# Patient Record
Sex: Female | Born: 1955 | Race: White | Hispanic: No | Smoking: Never smoker
Health system: Southern US, Community
[De-identification: ages and names within clinical notes are randomized; demographics above are authoritative.]

## PROBLEM LIST (undated history)

## (undated) DIAGNOSIS — I10 Essential (primary) hypertension: Secondary | ICD-10-CM

## (undated) DIAGNOSIS — M797 Fibromyalgia: Secondary | ICD-10-CM

## (undated) DIAGNOSIS — J45909 Unspecified asthma, uncomplicated: Secondary | ICD-10-CM

## (undated) DIAGNOSIS — K219 Gastro-esophageal reflux disease without esophagitis: Secondary | ICD-10-CM

## (undated) DIAGNOSIS — F419 Anxiety disorder, unspecified: Secondary | ICD-10-CM

## (undated) HISTORY — PX: NECK SURGERY: SHX720

---

## 1997-08-27 ENCOUNTER — Encounter: Admission: RE | Admit: 1997-08-27 | Discharge: 1997-11-25 | Payer: Self-pay | Admitting: Neurosurgery

## 1997-09-04 ENCOUNTER — Ambulatory Visit (HOSPITAL_COMMUNITY): Admission: RE | Admit: 1997-09-04 | Discharge: 1997-09-04 | Payer: Self-pay | Admitting: Neurosurgery

## 1998-02-17 ENCOUNTER — Encounter: Admission: RE | Admit: 1998-02-17 | Discharge: 1998-05-18 | Payer: Self-pay | Admitting: Orthopedic Surgery

## 1998-05-24 ENCOUNTER — Encounter: Admission: RE | Admit: 1998-05-24 | Discharge: 1998-08-22 | Payer: Self-pay | Admitting: Orthopedic Surgery

## 1999-02-04 ENCOUNTER — Encounter: Payer: Self-pay | Admitting: Neurosurgery

## 1999-02-04 ENCOUNTER — Ambulatory Visit (HOSPITAL_COMMUNITY): Admission: RE | Admit: 1999-02-04 | Discharge: 1999-02-04 | Payer: Self-pay | Admitting: Neurosurgery

## 2003-10-29 ENCOUNTER — Ambulatory Visit (HOSPITAL_COMMUNITY): Admission: RE | Admit: 2003-10-29 | Discharge: 2003-10-29 | Payer: Self-pay | Admitting: Family Medicine

## 2004-08-16 ENCOUNTER — Ambulatory Visit (HOSPITAL_COMMUNITY): Admission: RE | Admit: 2004-08-16 | Discharge: 2004-08-16 | Payer: Self-pay | Admitting: Neurosurgery

## 2005-02-03 ENCOUNTER — Encounter: Admission: RE | Admit: 2005-02-03 | Discharge: 2005-02-03 | Payer: Self-pay | Admitting: Orthopedic Surgery

## 2006-08-01 ENCOUNTER — Inpatient Hospital Stay (HOSPITAL_COMMUNITY): Admission: RE | Admit: 2006-08-01 | Discharge: 2006-08-02 | Payer: Self-pay | Admitting: Orthopaedic Surgery

## 2010-06-19 ENCOUNTER — Encounter: Payer: Self-pay | Admitting: Neurosurgery

## 2011-11-06 DIAGNOSIS — I10 Essential (primary) hypertension: Secondary | ICD-10-CM

## 2012-06-17 ENCOUNTER — Encounter (HOSPITAL_COMMUNITY): Payer: Self-pay | Admitting: Family Medicine

## 2012-06-17 ENCOUNTER — Emergency Department (HOSPITAL_COMMUNITY): Payer: Medicaid - Out of State

## 2012-06-17 ENCOUNTER — Emergency Department (HOSPITAL_COMMUNITY)
Admission: EM | Admit: 2012-06-17 | Discharge: 2012-06-17 | Disposition: A | Discharge status: Home / Self Care | Payer: Medicaid - Out of State | Attending: Emergency Medicine | Admitting: Emergency Medicine

## 2012-06-17 DIAGNOSIS — Z79899 Other long term (current) drug therapy: Secondary | ICD-10-CM | POA: Insufficient documentation

## 2012-06-17 DIAGNOSIS — I10 Essential (primary) hypertension: Secondary | ICD-10-CM | POA: Insufficient documentation

## 2012-06-17 DIAGNOSIS — K219 Gastro-esophageal reflux disease without esophagitis: Secondary | ICD-10-CM | POA: Insufficient documentation

## 2012-06-17 DIAGNOSIS — M549 Dorsalgia, unspecified: Secondary | ICD-10-CM

## 2012-06-17 DIAGNOSIS — IMO0002 Reserved for concepts with insufficient information to code with codable children: Secondary | ICD-10-CM | POA: Insufficient documentation

## 2012-06-17 DIAGNOSIS — Y92410 Unspecified street and highway as the place of occurrence of the external cause: Secondary | ICD-10-CM | POA: Insufficient documentation

## 2012-06-17 DIAGNOSIS — M542 Cervicalgia: Secondary | ICD-10-CM

## 2012-06-17 DIAGNOSIS — S0993XA Unspecified injury of face, initial encounter: Secondary | ICD-10-CM | POA: Insufficient documentation

## 2012-06-17 DIAGNOSIS — Y939 Activity, unspecified: Secondary | ICD-10-CM | POA: Insufficient documentation

## 2012-06-17 HISTORY — DX: Gastro-esophageal reflux disease without esophagitis: K21.9

## 2012-06-17 HISTORY — DX: Essential (primary) hypertension: I10

## 2012-06-17 MED ORDER — NAPROXEN 500 MG PO TABS: 500.0000 mg | ORAL_TABLET | Freq: Two times a day (BID) | ORAL | Status: DC
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
## 2012-06-17 NOTE — ED Notes (Signed)
NAD noted at time of d/c home with family 
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
## 2012-06-18 NOTE — ED Provider Notes (Signed)
Medical screening examination/treatment/procedure(s) were performed by non-physician practitioner and as supervising physician I was immediately available for consultation/collaboration.   Rolan Bucco, MD 06/18/12 1447

## 2017-01-19 ENCOUNTER — Inpatient Hospital Stay (HOSPITAL_COMMUNITY)
Admission: EM | Admit: 2017-01-19 | Discharge: 2017-01-22 | DRG: 305 | Disposition: A | Discharge status: Home / Self Care | Payer: Medicaid - Out of State | Attending: Family Medicine | Admitting: Family Medicine

## 2017-01-19 ENCOUNTER — Emergency Department (HOSPITAL_COMMUNITY): Payer: Medicaid - Out of State

## 2017-01-19 ENCOUNTER — Encounter (HOSPITAL_COMMUNITY): Payer: Self-pay | Admitting: Emergency Medicine

## 2017-01-19 DIAGNOSIS — I161 Hypertensive emergency: Secondary | ICD-10-CM | POA: Diagnosis present

## 2017-01-19 DIAGNOSIS — G8929 Other chronic pain: Secondary | ICD-10-CM | POA: Diagnosis present

## 2017-01-19 DIAGNOSIS — R51 Headache: Secondary | ICD-10-CM | POA: Diagnosis not present

## 2017-01-19 DIAGNOSIS — I160 Hypertensive urgency: Secondary | ICD-10-CM

## 2017-01-19 DIAGNOSIS — F1123 Opioid dependence with withdrawal: Secondary | ICD-10-CM | POA: Diagnosis present

## 2017-01-19 DIAGNOSIS — D6489 Other specified anemias: Secondary | ICD-10-CM | POA: Diagnosis present

## 2017-01-19 DIAGNOSIS — Z79899 Other long term (current) drug therapy: Secondary | ICD-10-CM

## 2017-01-19 DIAGNOSIS — K219 Gastro-esophageal reflux disease without esophagitis: Secondary | ICD-10-CM | POA: Diagnosis present

## 2017-01-19 DIAGNOSIS — N179 Acute kidney failure, unspecified: Secondary | ICD-10-CM | POA: Diagnosis present

## 2017-01-19 DIAGNOSIS — F13239 Sedative, hypnotic or anxiolytic dependence with withdrawal, unspecified: Secondary | ICD-10-CM | POA: Diagnosis present

## 2017-01-19 DIAGNOSIS — E876 Hypokalemia: Secondary | ICD-10-CM | POA: Diagnosis not present

## 2017-01-19 DIAGNOSIS — M549 Dorsalgia, unspecified: Secondary | ICD-10-CM | POA: Diagnosis present

## 2017-01-19 DIAGNOSIS — R112 Nausea with vomiting, unspecified: Secondary | ICD-10-CM

## 2017-01-19 DIAGNOSIS — J449 Chronic obstructive pulmonary disease, unspecified: Secondary | ICD-10-CM | POA: Diagnosis present

## 2017-01-19 DIAGNOSIS — F1193 Opioid use, unspecified with withdrawal: Secondary | ICD-10-CM

## 2017-01-19 DIAGNOSIS — F419 Anxiety disorder, unspecified: Secondary | ICD-10-CM | POA: Diagnosis present

## 2017-01-19 DIAGNOSIS — R519 Headache, unspecified: Secondary | ICD-10-CM

## 2017-01-19 LAB — CBC
HCT: 39.1 % (ref 36.0–46.0)
HEMOGLOBIN: 13.5 g/dL (ref 12.0–15.0)
MCH: 29.7 pg (ref 26.0–34.0)
MCHC: 34.5 g/dL (ref 30.0–36.0)
MCV: 85.9 fL (ref 78.0–100.0)
PLATELETS: 407 10*3/uL — AB (ref 150–400)
RBC: 4.55 MIL/uL (ref 3.87–5.11)
RDW: 13 % (ref 11.5–15.5)
WBC: 12.4 10*3/uL — AB (ref 4.0–10.5)

## 2017-01-19 LAB — RAPID URINE DRUG SCREEN, HOSP PERFORMED
AMPHETAMINES: NOT DETECTED
BENZODIAZEPINES: POSITIVE — AB
Barbiturates: NOT DETECTED
Cocaine: NOT DETECTED
OPIATES: NOT DETECTED
Tetrahydrocannabinol: NOT DETECTED

## 2017-01-19 LAB — COMPREHENSIVE METABOLIC PANEL
ALK PHOS: 70 U/L (ref 38–126)
ALT: 15 U/L (ref 14–54)
ANION GAP: 13 (ref 5–15)
AST: 22 U/L (ref 15–41)
Albumin: 4.8 g/dL (ref 3.5–5.0)
BUN: 9 mg/dL (ref 6–20)
CALCIUM: 10.3 mg/dL (ref 8.9–10.3)
CHLORIDE: 100 mmol/L — AB (ref 101–111)
CO2: 24 mmol/L (ref 22–32)
CREATININE: 0.99 mg/dL (ref 0.44–1.00)
GFR, EST NON AFRICAN AMERICAN: 60 mL/min — AB (ref >60)
Glucose, Bld: 104 mg/dL — ABNORMAL HIGH (ref 65–99)
Potassium: 4.6 mmol/L (ref 3.5–5.1)
Sodium: 137 mmol/L (ref 135–145)
Total Bilirubin: 0.7 mg/dL (ref 0.3–1.2)
Total Protein: 7.8 g/dL (ref 6.5–8.1)

## 2017-01-19 LAB — TROPONIN I: Troponin I: <0.03 ng/mL (ref <0.03)

## 2017-01-19 LAB — ETHANOL

## 2017-01-19 MED ORDER — ADULT MULTIVITAMIN W/MINERALS CH
1.0000 | ORAL_TABLET | Freq: Every day | ORAL | Status: DC
  Administered 2017-01-20 – 2017-01-22 (×4): 1 via ORAL
  Filled 2017-01-19 (×4): qty 1

## 2017-01-19 MED ORDER — NITROGLYCERIN IN D5W 200-5 MCG/ML-% IV SOLN
0.0000 ug/min | INTRAVENOUS | Status: AC
  Administered 2017-01-19: 5 ug/min via INTRAVENOUS

## 2017-01-19 MED ORDER — CLONIDINE HCL 0.1 MG PO TABS
0.1000 mg | ORAL_TABLET | Freq: Four times a day (QID) | ORAL | Status: DC
  Administered 2017-01-19: 0.1 mg via ORAL
  Filled 2017-01-19: qty 1

## 2017-01-19 MED ORDER — BUSPIRONE HCL 15 MG PO TABS
15.0000 mg | ORAL_TABLET | Freq: Three times a day (TID) | ORAL | Status: DC
  Administered 2017-01-20 – 2017-01-22 (×9): 15 mg via ORAL
  Filled 2017-01-19 (×9): qty 1

## 2017-01-19 MED ORDER — NITROGLYCERIN IN D5W 200-5 MCG/ML-% IV SOLN
0.0000 ug/min | Freq: Once | INTRAVENOUS | Status: DC
  Filled 2017-01-19: qty 250

## 2017-01-19 MED ORDER — DICYCLOMINE HCL 20 MG PO TABS
20.0000 mg | ORAL_TABLET | Freq: Four times a day (QID) | ORAL | Status: DC | PRN
  Administered 2017-01-21 (×2): 20 mg via ORAL
  Filled 2017-01-19 (×2): qty 1

## 2017-01-19 MED ORDER — ENOXAPARIN SODIUM 40 MG/0.4ML ~~LOC~~ SOLN
40.0000 mg | SUBCUTANEOUS | Status: DC
  Administered 2017-01-20 – 2017-01-22 (×3): 40 mg via SUBCUTANEOUS
  Filled 2017-01-19 (×3): qty 0.4

## 2017-01-19 MED ORDER — LISINOPRIL 20 MG PO TABS: 40.0000 mg | ORAL_TABLET | Freq: Every day | ORAL | Status: DC

## 2017-01-19 MED ORDER — NAPROXEN 250 MG PO TABS
500.0000 mg | ORAL_TABLET | Freq: Two times a day (BID) | ORAL | Status: DC | PRN
  Administered 2017-01-19: 500 mg via ORAL
  Filled 2017-01-19: qty 2

## 2017-01-19 MED ORDER — SODIUM CHLORIDE 0.9 % IV BOLUS (SEPSIS)
1000.0000 mL | Freq: Once | INTRAVENOUS | Status: AC
  Administered 2017-01-19: 1000 mL via INTRAVENOUS

## 2017-01-19 MED ORDER — CLONIDINE HCL 0.1 MG PO TABS: 0.1000 mg | ORAL_TABLET | Freq: Once | ORAL | Status: DC

## 2017-01-19 MED ORDER — VITAMIN B-1 100 MG PO TABS
100.0000 mg | ORAL_TABLET | Freq: Every day | ORAL | Status: DC
  Administered 2017-01-20 – 2017-01-22 (×4): 100 mg via ORAL
  Filled 2017-01-19 (×4): qty 1

## 2017-01-19 MED ORDER — ONDANSETRON 4 MG PO TBDP
4.0000 mg | ORAL_TABLET | Freq: Four times a day (QID) | ORAL | Status: DC | PRN
  Administered 2017-01-19 (×2): 4 mg via ORAL
  Filled 2017-01-19 (×2): qty 1

## 2017-01-19 MED ORDER — METHOCARBAMOL 500 MG PO TABS
500.0000 mg | ORAL_TABLET | Freq: Three times a day (TID) | ORAL | Status: DC | PRN
  Administered 2017-01-19 – 2017-01-22 (×6): 500 mg via ORAL
  Filled 2017-01-19 (×6): qty 1

## 2017-01-19 MED ORDER — FOLIC ACID 1 MG PO TABS
1.0000 mg | ORAL_TABLET | Freq: Every day | ORAL | Status: DC
  Administered 2017-01-20 – 2017-01-22 (×4): 1 mg via ORAL
  Filled 2017-01-19 (×4): qty 1

## 2017-01-19 MED ORDER — LOPERAMIDE HCL 2 MG PO CAPS: 2.0000 mg | ORAL_CAPSULE | ORAL | Status: DC | PRN

## 2017-01-19 MED ORDER — THIAMINE HCL 100 MG/ML IJ SOLN: 100.0000 mg | Freq: Every day | INTRAMUSCULAR | Status: DC

## 2017-01-19 MED ORDER — PROMETHAZINE HCL 25 MG/ML IJ SOLN
25.0000 mg | Freq: Once | INTRAMUSCULAR | Status: AC
  Administered 2017-01-19: 25 mg via INTRAVENOUS
  Filled 2017-01-19: qty 1

## 2017-01-19 MED ORDER — CLONIDINE HCL 0.1 MG PO TABS: 0.1000 mg | ORAL_TABLET | ORAL | Status: DC

## 2017-01-19 MED ORDER — CLONIDINE HCL 0.1 MG PO TABS: 0.1000 mg | ORAL_TABLET | Freq: Every day | ORAL | Status: DC

## 2017-01-19 MED ORDER — LORAZEPAM 2 MG/ML IJ SOLN
1.0000 mg | Freq: Once | INTRAMUSCULAR | Status: AC
  Administered 2017-01-19: 1 mg via INTRAVENOUS
  Filled 2017-01-19: qty 1

## 2017-01-19 NOTE — ED Notes (Signed)
Pt provided with turkey sandwhich

## 2017-01-19 NOTE — ED Triage Notes (Signed)
Pt reports that about 2 weeks ago she decided to stop taking her oxycodone that she had been on x12 years, pt saw pcp who advised she should be okay to transition off of pain meds at home as long as her bp did not get too high. Pt reports she has been taking bp meds at home but bp continues to run around 190s, pt tremulous and somewhat anxious appearing in triage. A/ox4.

## 2017-01-19 NOTE — ED Notes (Signed)
Attempted report x1. 

## 2017-01-19 NOTE — ED Notes (Signed)
EDP at bedside  

## 2017-01-19 NOTE — ED Provider Notes (Signed)
[facility]-EMERGENCY DEPT Provider Note   CSN: [id number] Arrival date & time: 01/19/17  1046     History   Chief Complaint Chief Complaint  Patient presents with  . Withdrawal    HPI Cynthia Yang is a 61 y.o. female.  Patient presents from home with complaint of multiple symptoms including nausea, vomiting, diarrhea, high blood pressure, anxiety. Patient reports taking 20 mg of oxycodone every 4 hours for chronic neck pain prior to 2 weeks ago. She decided to stop her medications without tapering at that time. 3-4 days later she began having the symptoms which have been persistent. She has seen her primary care physician who has prescribed Zofran. Patient tolerated symptoms for as long as she could, but felt so bad today and had elevated blood pressure so she can decided to come to the emergency department. He has had persistent vomiting and watery diarrhea without blood. No fevers but does report chills. No URI symptoms, chest pain, shortness of breath, abdominal pain. No lower extremity swelling or rashes. No neck pain or vision changes. Patient does not want to go back on narcotic pain medications. The onset of this condition was acute. The course is constant. Aggravating factors: none. Alleviating factors: none.        Past Medical History:  Diagnosis Date  . GERD (gastroesophageal reflux disease)   . Hypertension     There are no active problems to display for this patient.   Past Surgical History:  Procedure Laterality Date  . NECK SURGERY      OB History    No data available       Home Medications    Prior to Admission medications   Medication Sig Start Date End Date Taking? Authorizing Provider  fentaNYL (DURAGESIC - DOSED MCG/HR) 75 MCG/HR Place 1 patch onto the skin every 3 (three) days.    [provider]  gabapentin (NEURONTIN) 300 MG capsule Take 300 mg by mouth 3 (three) times daily.    [provider]  lisinopril (PRINIVIL,ZESTRIL)  20 MG tablet Take 20 mg by mouth daily.    [provider]  loratadine (CLARITIN) 5 MG/5ML syrup Take 10 mg by mouth daily.    [provider]  naproxen (NAPROSYN) 500 MG tablet Take 1 tablet (500 mg total) by mouth 2 (two) times daily. 06/17/12   Santiago Glad, PA-C  omeprazole (PRILOSEC) 40 MG capsule Take 40 mg by mouth 2 (two) times daily.    [provider]  oxycodone (ROXICODONE) 30 MG immediate release tablet Take 30 mg by mouth 4 (four) times daily as needed. For pain    [provider]  tiZANidine (ZANAFLEX) 2 MG tablet Take 2 mg by mouth 4 (four) times daily as needed. For muscle spasms.    [provider]    Family History No family history on file.  Social History Social History  Substance Use Topics  . Smoking status: Never Smoker  . Smokeless tobacco: Not on file  . Alcohol use No     Allergies   Morphine and related and Vistaril [hydroxyzine hcl]   Review of Systems Review of Systems  Constitutional: Positive for appetite change and chills. Negative for fever.  HENT: Negative for rhinorrhea and sore throat.   Eyes: Negative for redness.  Respiratory: Negative for cough and shortness of breath.   Cardiovascular: Negative for chest pain.  Gastrointestinal: Positive for diarrhea, nausea and vomiting. Negative for abdominal pain and blood in stool.  Genitourinary: Negative for  dysuria.  Musculoskeletal: Positive for myalgias.  Skin: Negative for rash.  Neurological: Positive for headaches.  Psychiatric/Behavioral: The patient is nervous/anxious.      Physical Exam Updated Vital Signs BP (!) 212/113   Pulse 91   Temp 98.3 F (36.8 C)   Resp (!) 21   Ht 5\' 3"  (1.6 m)   Wt 63.5 kg (140 lb)   SpO2 97%   BMI 24.80 kg/m   Physical Exam  Constitutional: She appears well-developed and well-nourished.  HENT:  Head: Normocephalic and atraumatic.  Slightly dry oral mucosa.  Eyes: Conjunctivae are normal. Right  eye exhibits no discharge. Left eye exhibits no discharge.  Neck: Normal range of motion. Neck supple.  Cardiovascular: Normal rate, regular rhythm and normal heart sounds.   No murmur heard. HR Upper 90's   Pulmonary/Chest: Effort normal and breath sounds normal. No respiratory distress. She has no wheezes. She has no rales.  Abdominal: Soft. There is no tenderness. There is no rebound.  Musculoskeletal: Normal range of motion. She exhibits tenderness (generalized muscle aches).  Neurological: She is alert.  Skin: Skin is warm and dry.  Psychiatric: She has a normal mood and affect.  Nursing note and vitals reviewed.    ED Treatments / Results  Labs (all labs ordered are listed, but only abnormal results are displayed) Labs Reviewed  COMPREHENSIVE METABOLIC PANEL - Abnormal; Notable for the following:       Result Value   Chloride 100 (*)    Glucose, Bld 104 (*)    GFR calc non Af Amer 60 (*)    All other components within normal limits  CBC - Abnormal; Notable for the following:    WBC 12.4 (*)    Platelets 407 (*)    All other components within normal limits  RAPID URINE DRUG SCREEN, HOSP PERFORMED - Abnormal; Notable for the following:    Benzodiazepines POSITIVE (*)    All other components within normal limits  ETHANOL    EKG  EKG Interpretation None       Radiology Ct Head Wo Contrast  Result Date: 01/19/2017 CLINICAL DATA:  Headache and vomiting for 1 week. EXAM: CT HEAD WITHOUT CONTRAST TECHNIQUE: Contiguous axial images were obtained from the base of the skull through the vertex without intravenous contrast. COMPARISON:  None. FINDINGS: Brain: There is no evidence of acute infarct, intracranial hemorrhage, mass, midline shift, or extra-axial fluid collection. The ventricles and sulci are within normal limits for age. Vascular: Mild carotid siphon calcification.  No hyperdense vessel. Skull: Partially visualized, chronic appearing nasal bone fracture.  Sinuses/Orbits: No significant inflammatory disease in the included paranasal sinuses are mastoid air cells. Unremarkable orbits. Other: None. IMPRESSION: No evidence of acute intracranial abnormality. Unremarkable CT appearance of the brain. Electronically Signed   By: Sebastian Ache M.D.   On: 01/19/2017 17:10    Procedures Procedures (including critical care time)  Medications Ordered in ED Medications  dicyclomine (BENTYL) tablet 20 mg (not administered)  loperamide (IMODIUM) capsule 2-4 mg (not administered)  methocarbamol (ROBAXIN) tablet 500 mg (500 mg Oral Given 01/19/17 1303)  naproxen (NAPROSYN) tablet 500 mg (500 mg Oral Given 01/19/17 1707)  ondansetron (ZOFRAN-ODT) disintegrating tablet 4 mg (4 mg Oral Given 01/19/17 1302)  nitroGLYCERIN 50 mg in dextrose 5 % 250 mL (0.2 mg/mL) infusion (5 mcg/min Intravenous New Bag/Given 01/19/17 1706)  sodium chloride 0.9 % bolus 1,000 mL (0 mLs Intravenous Stopped 01/19/17 1433)  sodium chloride 0.9 % bolus 1,000 mL (0 mLs  Intravenous Stopped 01/19/17 1706)  LORazepam (ATIVAN) injection 1 mg (1 mg Intravenous Given 01/19/17 1553)  promethazine (PHENERGAN) injection 25 mg (25 mg Intravenous Given 01/19/17 1554)     Initial Impression / Assessment and Plan / ED Course  I have reviewed the triage vital signs and the nursing notes.  Pertinent labs & imaging results that were available during my care of the patient were reviewed by me and considered in my medical decision making (see chart for details).     Patient seen and examined. Work-up initiated. Medications ordered. D/w Dr. Clayborne Dana.   Vital signs reviewed and are as follows: BP (!) 191/96   Pulse 82   Temp 98.3 F (36.8 C)   Resp (!) 21   Ht 5\' 3"  (1.6 m)   Wt 63.5 kg (140 lb)   SpO2 96%   BMI 24.80 kg/m   Pt with improvement for about an hr after first round of medications. I was hopeful that she would be able to go home on oral medications for sx control.   On recheck, her BP  elevated > 200 systolic again, and had vomiting despite antiemetics. She complained of severe headache and restlessness.  Given this, head CT ordered which does not show any acute findings. Patient was treated with Ativan and Phenergan with improvement. Given persistently elevated blood pressure, patient started on IV nitroglycerin with goal of 160-180 systolic blood pressure.  CRITICAL CARE Performed by: Carolee Rota Total critical care time: 40 minutes Critical care time was exclusive of separately billable procedures and treating other patients. Critical care was necessary to treat or prevent imminent or life-threatening deterioration. Critical care was time spent personally by me on the following activities: development of treatment plan with patient and/or surrogate as well as nursing, discussions with consultants, evaluation of patient's response to treatment, examination of patient, obtaining history from patient or surrogate, ordering and performing treatments and interventions, ordering and review of laboratory studies, ordering and review of radiographic studies, pulse oximetry and re-evaluation of patient's condition.   Final Clinical Impressions(s) / ED Diagnoses   Final diagnoses:  Hypertensive emergency  Intractable vomiting with nausea, unspecified vomiting type  Acute nonintractable headache, unspecified headache type   Admit for possible opioid withdrawal, severely elevated BP, and intractable vomiting. Requires IV drip for BP control and IV antiemetics for vomiting.    New Prescriptions New Prescriptions   No medications on file     Renne Crigler, Cordelia Poche 01/19/17 1803    Mesner, Barbara Cower, MD 01/21/17 [phone]
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
## 2017-01-20 ENCOUNTER — Encounter (HOSPITAL_COMMUNITY): Payer: Self-pay

## 2017-01-20 DIAGNOSIS — E876 Hypokalemia: Secondary | ICD-10-CM

## 2017-01-20 DIAGNOSIS — N179 Acute kidney failure, unspecified: Secondary | ICD-10-CM

## 2017-01-20 DIAGNOSIS — F1193 Opioid use, unspecified with withdrawal: Secondary | ICD-10-CM

## 2017-01-20 DIAGNOSIS — I160 Hypertensive urgency: Secondary | ICD-10-CM

## 2017-01-20 LAB — TROPONIN I: Troponin I: <0.03 ng/mL (ref <0.03)

## 2017-01-20 LAB — COMPREHENSIVE METABOLIC PANEL
ALBUMIN: 3.8 g/dL (ref 3.5–5.0)
ALK PHOS: 52 U/L (ref 38–126)
ALT: 12 U/L — AB (ref 14–54)
AST: 19 U/L (ref 15–41)
Anion gap: 8 (ref 5–15)
BUN: 10 mg/dL (ref 6–20)
CALCIUM: 9.1 mg/dL (ref 8.9–10.3)
CO2: 24 mmol/L (ref 22–32)
CREATININE: 1.05 mg/dL — AB (ref 0.44–1.00)
Chloride: 106 mmol/L (ref 101–111)
GFR calc Af Amer: >60 mL/min (ref >60)
GFR calc non Af Amer: 56 mL/min — ABNORMAL LOW (ref >60)
GLUCOSE: 113 mg/dL — AB (ref 65–99)
Potassium: 3.4 mmol/L — ABNORMAL LOW (ref 3.5–5.1)
SODIUM: 138 mmol/L (ref 135–145)
Total Bilirubin: 0.6 mg/dL (ref 0.3–1.2)
Total Protein: 6 g/dL — ABNORMAL LOW (ref 6.5–8.1)

## 2017-01-20 LAB — CBC
HCT: 30.2 % — ABNORMAL LOW (ref 36.0–46.0)
Hemoglobin: 10.6 g/dL — ABNORMAL LOW (ref 12.0–15.0)
MCH: 30.1 pg (ref 26.0–34.0)
MCHC: 35.1 g/dL (ref 30.0–36.0)
MCV: 85.8 fL (ref 78.0–100.0)
PLATELETS: 310 10*3/uL (ref 150–400)
RBC: 3.52 MIL/uL — ABNORMAL LOW (ref 3.87–5.11)
RDW: 13.1 % (ref 11.5–15.5)
WBC: 8.6 10*3/uL (ref 4.0–10.5)

## 2017-01-20 LAB — HIV ANTIBODY (ROUTINE TESTING W REFLEX): HIV Screen 4th Generation wRfx: NONREACTIVE

## 2017-01-20 LAB — MRSA PCR SCREENING: MRSA BY PCR: NEGATIVE

## 2017-01-20 MED ORDER — LORAZEPAM 2 MG/ML IJ SOLN
1.0000 mg | Freq: Three times a day (TID) | INTRAMUSCULAR | Status: DC | PRN
  Administered 2017-01-20 – 2017-01-21 (×4): 1 mg via INTRAVENOUS
  Filled 2017-01-20 (×4): qty 1

## 2017-01-20 MED ORDER — POTASSIUM CHLORIDE CRYS ER 20 MEQ PO TBCR
40.0000 meq | EXTENDED_RELEASE_TABLET | Freq: Once | ORAL | Status: AC
  Administered 2017-01-20: 40 meq via ORAL
  Filled 2017-01-20: qty 2

## 2017-01-20 MED ORDER — METOPROLOL TARTRATE 25 MG PO TABS
25.0000 mg | ORAL_TABLET | Freq: Two times a day (BID) | ORAL | Status: DC
  Administered 2017-01-20 (×2): 25 mg via ORAL
  Filled 2017-01-20 (×2): qty 1

## 2017-01-20 MED ORDER — ACETAMINOPHEN 325 MG PO TABS
650.0000 mg | ORAL_TABLET | Freq: Four times a day (QID) | ORAL | Status: DC | PRN
  Administered 2017-01-20 – 2017-01-21 (×3): 650 mg via ORAL
  Filled 2017-01-20 (×3): qty 2

## 2017-01-20 MED ORDER — HYDRALAZINE HCL 20 MG/ML IJ SOLN
5.0000 mg | Freq: Four times a day (QID) | INTRAMUSCULAR | Status: DC | PRN
  Administered 2017-01-20 – 2017-01-21 (×4): 5 mg via INTRAVENOUS
  Filled 2017-01-20 (×4): qty 1

## 2017-01-20 MED ORDER — METOPROLOL TARTRATE 25 MG PO TABS: 25.0000 mg | ORAL_TABLET | Freq: Two times a day (BID) | ORAL | Status: DC

## 2017-01-20 MED ORDER — ONDANSETRON HCL 4 MG/2ML IJ SOLN: 4.0000 mg | Freq: Four times a day (QID) | INTRAMUSCULAR | Status: DC | PRN

## 2017-01-20 MED ORDER — ZOLPIDEM TARTRATE 5 MG PO TABS
5.0000 mg | ORAL_TABLET | Freq: Every evening | ORAL | Status: DC | PRN
  Administered 2017-01-20 (×2): 5 mg via ORAL
  Filled 2017-01-20 (×2): qty 1

## 2017-01-20 NOTE — Progress Notes (Signed)
Patient went got up and walked to the bathroom

## 2017-01-20 NOTE — Progress Notes (Signed)
Family Medicine Teaching Service Daily Progress Note Intern Pager: [phone]  Patient name: Cynthia Yang Medical record number: [id number] Date of birth: 1956/03/17 Age: 61 y.o. Gender: female  Primary Care Provider: Default, Provider, MD Consultants: none Code Status: FULL  Pt Overview and Major Events to Date:  8/24 admitted with hypertensive urgency  Assessment and Plan: Cynthia Yang is a 61 y.o. female presenting with persistent HTN and slowly improving nausea/headaches/vomiting 2weeks after self discontinuing all chronic opioid medications. PMH is significant for spinal stimulator and multiple spine surgeries, HTN, anxiety, chronic pain.   Hypertensive emergency, resolved: BP 160s/70s this am. Patient allergic to norvasc due to leg swelling per her report. -discontinue nitro drip -start metoprolol 25mg  BID -consider restarting lisinopril pending improvement in AKI  Hypokalemia: K 3.4. -replete with Kdur once  Anemia: Hgb dropped from 13.5 to 10.6, all additional cell lines decreased, likely secondary to hemodilution. No clinical signs of bleeding. -FOBT  AKI: Cr increased from 0.99 to 1.05. Will not give fluids due to hypertension, continue to monitor.  -avoid nephrotoxic meds  Opioid withdrawal- UDS (-) for opiates, patient stopped 20mg  Oxy Q4hr cold [place] approximately 2wks ago after her pain clinic did not refill her meds when she had a urine + for benoz after being weaned from xanax. She says at that point she didn't want pain meds controlling her life and stopped without consulting her doctor. She had significant vomiting/diarrhea/muscle cramps/headaches/blurry vision all of which have started to subside except for the elevated blood pressure. -CIWA/COWS scoring -continue bentyl, imodium, robaxin for symptomatic treatment  Chronic back pain- multiple back surgeries w/ pain formerly managed on high dose opioids -monitor on exam, consider  gapapentin  COPD- has only albuterol but has not been using it at home. Hx of additional inhalers prescribed, patient cannot recall nor does she report using any inhalers daily recently.  -albuterol PRN  FEN/GI: loperamide, zofran  Prophylaxis: lovenox  Disposition: continued inpatient management of poorly controlled HTN and AKI  Subjective:  Patient feels well, would like to stay to try to continue to work on blood pressure. She states her BP often increases after standing.  Objective: Temp:  [97.6 F (36.4 C)-98.7 F (37.1 C)] 98.7 F (37.1 C) (08/25 0727) Pulse Rate:  [73-106] 80 (08/25 0727) Resp:  [13-30] 17 (08/25 0727) BP: (153-235)/(65-115) 186/82 (08/25 0727) SpO2:  [85 %-98 %] 97 % (08/25 0727) Weight:  [139 lb 12.4 oz (63.4 kg)-140 lb (63.5 kg)] 139 lb 12.4 oz (63.4 kg) (08/24 2333) Physical Exam: General: thin well appearing woman in NAD. Cardiovascular: 3/6 systolic murmur, regular rate and rhythm Respiratory: CTAB, easy WOB  Abdomen: SNTND, +BS Extremities: no edema, warm  Laboratory:  Recent Labs Lab 01/19/17 1108 01/20/17 0204  WBC 12.4* 8.6  HGB 13.5 10.6*  HCT 39.1 30.2*  PLT 407* 310    Recent Labs Lab 01/19/17 1108 01/20/17 0204  NA 137 138  K 4.6 3.4*  CL 100* 106  CO2 24 24  BUN 9 10  CREATININE 0.99 1.05*  CALCIUM 10.3 9.1  PROT 7.8 6.0*  BILITOT 0.7 0.6  ALKPHOS 70 52  ALT 15 12*  AST 22 19  GLUCOSE 104* 113*   Trop neg x 3  Imaging/Diagnostic Tests: No new imaging.  [name], MD 01/20/2017, 8:28 AM PGY-2, [place] [facility] FPTS Intern pager: [phone], text pages welcome

## 2017-01-20 NOTE — Progress Notes (Signed)
Pt had 5 beats of v tach. Vitals are WNL and pt states she feels fine. No chest pain or SOB. Will continue to monitor.   Larey Days, RN

## 2017-01-21 LAB — BASIC METABOLIC PANEL
ANION GAP: 10 (ref 5–15)
BUN: 13 mg/dL (ref 6–20)
CO2: 23 mmol/L (ref 22–32)
Calcium: 9.2 mg/dL (ref 8.9–10.3)
Chloride: 104 mmol/L (ref 101–111)
Creatinine, Ser: 1.13 mg/dL — ABNORMAL HIGH (ref 0.44–1.00)
GFR calc Af Amer: 59 mL/min — ABNORMAL LOW (ref >60)
GFR, EST NON AFRICAN AMERICAN: 51 mL/min — AB (ref >60)
Glucose, Bld: 103 mg/dL — ABNORMAL HIGH (ref 65–99)
POTASSIUM: 4.1 mmol/L (ref 3.5–5.1)
SODIUM: 137 mmol/L (ref 135–145)

## 2017-01-21 LAB — CBC
HEMATOCRIT: 34 % — AB (ref 36.0–46.0)
HEMOGLOBIN: 11.7 g/dL — AB (ref 12.0–15.0)
MCH: 29.9 pg (ref 26.0–34.0)
MCHC: 34.4 g/dL (ref 30.0–36.0)
MCV: 87 fL (ref 78.0–100.0)
Platelets: 358 10*3/uL (ref 150–400)
RBC: 3.91 MIL/uL (ref 3.87–5.11)
RDW: 13.1 % (ref 11.5–15.5)
WBC: 10.6 10*3/uL — AB (ref 4.0–10.5)

## 2017-01-21 MED ORDER — ZOLPIDEM TARTRATE 5 MG PO TABS
5.0000 mg | ORAL_TABLET | Freq: Once | ORAL | Status: AC
  Administered 2017-01-21: 5 mg via ORAL
  Filled 2017-01-21: qty 1

## 2017-01-21 MED ORDER — ZOLPIDEM TARTRATE 5 MG PO TABS: 5.0000 mg | ORAL_TABLET | Freq: Every evening | ORAL | Status: DC | PRN

## 2017-01-21 MED ORDER — ALPRAZOLAM 0.25 MG PO TABS
0.2500 mg | ORAL_TABLET | Freq: Every day | ORAL | Status: DC
  Administered 2017-01-21: 0.25 mg via ORAL
  Filled 2017-01-21: qty 1

## 2017-01-21 MED ORDER — ALPRAZOLAM 0.25 MG PO TABS
0.2500 mg | ORAL_TABLET | Freq: Every evening | ORAL | Status: DC | PRN
  Administered 2017-01-21: 0.25 mg via ORAL
  Filled 2017-01-21: qty 1

## 2017-01-21 MED ORDER — METOPROLOL TARTRATE 50 MG PO TABS
50.0000 mg | ORAL_TABLET | Freq: Two times a day (BID) | ORAL | Status: DC
  Administered 2017-01-21 – 2017-01-22 (×3): 50 mg via ORAL
  Filled 2017-01-21 (×3): qty 1

## 2017-01-21 MED ORDER — ZOLPIDEM TARTRATE 5 MG PO TABS: 5.0000 mg | ORAL_TABLET | Freq: Once | ORAL | Status: DC

## 2017-01-21 MED ORDER — ZOLPIDEM TARTRATE 5 MG PO TABS
5.0000 mg | ORAL_TABLET | Freq: Every day | ORAL | Status: DC
  Administered 2017-01-21: 5 mg via ORAL
  Filled 2017-01-21: qty 1

## 2017-01-21 MED ORDER — SODIUM CHLORIDE 0.9 % IV SOLN
INTRAVENOUS | Status: AC
  Administered 2017-01-21: 10:00:00 via INTRAVENOUS

## 2017-01-21 NOTE — Progress Notes (Signed)
After hydralazine, B/P is 160/88 and HR is 133. MD notified. No further orders. Will continue to monitor.

## 2017-01-21 NOTE — Progress Notes (Signed)
Patients B/P is 188/78. B/P meds given. MD notified. No further orders. Will continue to monitor.

## 2017-01-21 NOTE — Progress Notes (Signed)
Family Medicine Teaching Service Daily Progress Note Intern Pager: [phone]  Patient name: Cynthia Yang Medical record number: [id number] Date of birth: [birth year] Age: 61 y.o. Gender: female  Primary Care Provider: Default, Provider, MD Consultants: none Code Status: FULL  Pt Overview and Major Events to Date:  8/24 admitted with hypertensive urgency  Assessment and Plan: Cynthia Yang is a 61 y.o. female presenting with persistent HTN and slowly improving nausea/headaches/vomiting 2weeks after self discontinuing all chronic opioid medications. PMH is significant for spinal stimulator and multiple spine surgeries, HTN, anxiety, chronic pain.   Hypertensive emergency, resolved: BP 180s/70s this am. Denies headaches or blurry vision but with worsening kidney function. Patient allergic to norvasc due to leg swelling per her report. S/p nitro gtt on admission for HTN, now on oral meds. - metoprolol 25mg  BID>> increase 50 mg BID today - consider restarting lisinopril pending improvement in AKI  Anxiety, concern for benzo withdrawal - patient notably anxious in the room today and tremulous, tachycardic, pressured speech. UDS + for benzos on admission. Patient notes she last took home xanax on Wednesday and prior to that she had been taking Xanax 0.25 mg TID. - continue Buspar 15 mg TID - will give Xanax 0.25 mg daily to prevent benzo withdrawal - continue CIWA  Elevated creatinine, worsening: Cr increased from 0.99 on admission to 1.13.  Careful balance given HTN. will add very gentle fluids at 50 ml/hr IVNS x8 hours.  - avoid nephrotoxic meds  Anemia, stable : Hgb dropped from 13.5 on admission, 11.7 today. All additional cell lines decreased, likely secondary to hemodilution. No clinical signs of bleeding. - FOBT pending   Opioid withdrawal- UDS (-) for opiates, patient stopped 20mg  q4hr cold [place] approximately 2wks ago after her pain clinic did not refill her meds when she had  a urine + for benoz after being weaned from xanax. Denies any other drug use. -CIWA/COWS scoring 3, 2 overnight  -continue bentyl, imodium, robaxin for symptomatic treatment  Chronic back pain- multiple back surgeries w/ pain formerly managed on high dose opioids -monitor on exam, consider gapapentin  COPD- has only albuterol but has not been using it at home. Hx of additional inhalers prescribed, patient cannot recall nor does she report using any inhalers daily recently.  - albuterol PRN  FEN/GI: loperamide, zofran  Prophylaxis: lovenox  Disposition: continued inpatient management of poorly controlled HTN and AKI  Subjective:  Patient anxious, with mildly pressured speech in the room this AM. Noted to be tachycardic, nursing to get repeat vitals.  Objective: Temp:  [98.1 F (36.7 C)-99.1 F (37.3 C)] 99 F (37.2 C) (08/26 0433) Pulse Rate:  [71-88] 74 (08/26 0433) Resp:  [16-29] 18 (08/26 0433) BP: (158-188)/(63-95) 188/78 (08/26 0817) SpO2:  [92 %-99 %] 98 % (08/26 0433) Weight:  [137 lb 3.2 oz (62.2 kg)] 137 lb 3.2 oz (62.2 kg) (08/25 2035) Physical Exam: General: visibly anxious, tremulous, otherwise nontoxic appearing Cardiovascular: 3/6 systolic murmur, +tachycardia Respiratory: CTA bil, no W/R/R Abdomen: SNTND, +BS Extremities: no edema, warm  Laboratory:  Recent Labs Lab 01/19/17 1108 01/20/17 0204 01/21/17 0249  WBC 12.4* 8.6 10.6*  HGB 13.5 10.6* 11.7*  HCT 39.1 30.2* 34.0*  PLT 407* 310 358    Recent Labs Lab 01/19/17 1108 01/20/17 0204 01/21/17 0249  NA 137 138 137  K 4.6 3.4* 4.1  CL 100* 106 104  CO2 24 24 23   BUN 9 10 13   CREATININE 0.99 1.05* 1.13*  CALCIUM 10.3 9.1  9.2  PROT 7.8 6.0*  --   BILITOT 0.7 0.6  --   ALKPHOS 70 52  --   ALT 15 12*  --   AST 22 19  --   GLUCOSE 104* 113* 103*   Trop neg x 3  Imaging/Diagnostic Tests: No results found.  Howard Pouch, MD 01/21/2017, 8:25 AM PGY-2, [facility] Family Medicine FPTS  Intern pager: [phone], text pages welcome

## 2017-01-21 NOTE — Progress Notes (Signed)
Central telemetry called and stated patient had a small run of SVT. I assessed the patient. Patient states that she feels fine. No further complications assessed. MD notified. No further orders. Will continue to monitor.

## 2017-01-22 DIAGNOSIS — R112 Nausea with vomiting, unspecified: Secondary | ICD-10-CM

## 2017-01-22 LAB — BASIC METABOLIC PANEL
Anion gap: 12 (ref 5–15)
BUN: 11 mg/dL (ref 6–20)
CHLORIDE: 105 mmol/L (ref 101–111)
CO2: 20 mmol/L — AB (ref 22–32)
Calcium: 9.6 mg/dL (ref 8.9–10.3)
Creatinine, Ser: 1.01 mg/dL — ABNORMAL HIGH (ref 0.44–1.00)
GFR calc Af Amer: >60 mL/min (ref >60)
GFR calc non Af Amer: 58 mL/min — ABNORMAL LOW (ref >60)
Glucose, Bld: 112 mg/dL — ABNORMAL HIGH (ref 65–99)
POTASSIUM: 4.2 mmol/L (ref 3.5–5.1)
SODIUM: 137 mmol/L (ref 135–145)

## 2017-01-22 LAB — CBC
HEMATOCRIT: 36.6 % (ref 36.0–46.0)
Hemoglobin: 12.8 g/dL (ref 12.0–15.0)
MCH: 30.2 pg (ref 26.0–34.0)
MCHC: 35 g/dL (ref 30.0–36.0)
MCV: 86.3 fL (ref 78.0–100.0)
Platelets: 403 10*3/uL — ABNORMAL HIGH (ref 150–400)
RBC: 4.24 MIL/uL (ref 3.87–5.11)
RDW: 13.2 % (ref 11.5–15.5)
WBC: 8.4 10*3/uL (ref 4.0–10.5)

## 2017-01-22 MED ORDER — CLONAZEPAM 0.5 MG PO TABS: 0.2500 mg | ORAL_TABLET | Freq: Every day | ORAL | Status: DC

## 2017-01-22 MED ORDER — DICYCLOMINE HCL 20 MG PO TABS: 20.0000 mg | ORAL_TABLET | Freq: Four times a day (QID) | ORAL | 0 refills | Status: DC | PRN

## 2017-01-22 MED ORDER — LORAZEPAM 2 MG/ML IJ SOLN: 0.5000 mg | Freq: Once | INTRAMUSCULAR | Status: DC

## 2017-01-22 MED ORDER — LORAZEPAM 2 MG/ML IJ SOLN
0.5000 mg | Freq: Once | INTRAMUSCULAR | Status: AC
  Administered 2017-01-22: 0.5 mg via INTRAVENOUS
  Filled 2017-01-22: qty 1

## 2017-01-22 MED ORDER — LOPERAMIDE HCL 2 MG PO CAPS: 2.0000 mg | ORAL_CAPSULE | ORAL | 0 refills | Status: DC | PRN

## 2017-01-22 MED ORDER — ZOLPIDEM TARTRATE 5 MG PO TABS: 5.0000 mg | ORAL_TABLET | Freq: Every day | ORAL | 0 refills | Status: DC

## 2017-01-22 MED ORDER — CLONAZEPAM 0.5 MG PO TABS
0.5000 mg | ORAL_TABLET | Freq: Every day | ORAL | Status: DC
  Administered 2017-01-22: 0.5 mg via ORAL
  Filled 2017-01-22: qty 1

## 2017-01-22 MED ORDER — CLONAZEPAM 0.5 MG PO TABS: 0.5000 mg | ORAL_TABLET | Freq: Every day | ORAL | 0 refills | Status: DC

## 2017-01-22 MED ORDER — METOPROLOL TARTRATE 50 MG PO TABS: 50.0000 mg | ORAL_TABLET | Freq: Two times a day (BID) | ORAL | 0 refills | Status: AC

## 2017-01-22 MED ORDER — METHOCARBAMOL 500 MG PO TABS: 500.0000 mg | ORAL_TABLET | Freq: Three times a day (TID) | ORAL | 0 refills | Status: DC | PRN

## 2017-01-22 NOTE — Discharge Summary (Signed)
Family Medicine Teaching [facility] Medicine Discharge Summary  Patient name: Cynthia Yang Medical record number: [id number] Date of birth: 1956-05-06 Age: 61 y.o. Gender: female Date of Admission: 01/19/2017  Date of Discharge: 01/22/17 Admitting Physician: Nestor Ramp, MD  Primary Care Provider: Default, Provider, MD Consultants:   Indication for Hospitalization: hypertensive urgency, opioid withdrawl  Discharge Diagnoses/Problem List:  Hypertension Opioid withdrawal Benzo withdrawal anxiety  Disposition: to home w/ pcp followup this week  Discharge Condition: stable  Discharge Exam: General: visibly anxious, tremulous, otherwise nontoxic appearing Cardiovascular: 3/6 systolic murmur, +tachycardia Respiratory: CTA bil, no W/R/R Abdomen: SNTND, +BS Extremities: no edema, warm  Brief Hospital Course:  Patient was admitted with HTN >200 systolic while taking her home dose lisinopril during the last two weeks as she withdrew from a chronic opioid dose of oxy 20mg  q4h to no opioids for the last 2 weeks.   She expressed resolving symptoms over that time including visual changes, headaches, vomiting, diarrhea, abdominal cramping and insomnia.  Her only current symptoms are the HTN and headaches upon admission.   Due to rising creatinine, she was started on metoprolol tartrate and controlled for safe discharge on 50mg  BID.   Her lisinopril was held until outpatient followup with her pcp.   She expressed increased anxiety while inpatient as she had also been weaned off her xanex.  She was given 5 days of klonipin to get her to her outpatient appt. With pcp for evaluation of which anxiety treatment they found appropriate.    Issues for Follow Up:  1. Patient went cold [place] on chronic opioid medications, please help monitor to minimize risk of seeking opioids through illicit means 2. Patient was asking about more xanex for anxiety.   We gave 5 days of klonipin to get them to their pcp appt.    Please evaluate for what anxiety treatment you find appropriate. 3. Patient was >200sbp on admission.   Was somewhate controlled on metoprolol tartrate 50mg  BID.   Home lisinopril was held for rising creatinine.   Please evaluate which long term medication(s) you find appropriate  Significant Procedures:   Significant Labs and Imaging:   Recent Labs Lab 01/20/17 0204 01/21/17 0249 01/22/17 0719  WBC 8.6 10.6* 8.4  HGB 10.6* 11.7* 12.8  HCT 30.2* 34.0* 36.6  PLT 310 358 403*    Recent Labs Lab 01/19/17 1108 01/20/17 0204 01/21/17 0249 01/22/17 0719  NA 137 138 137 137  K 4.6 3.4* 4.1 4.2  CL 100* 106 104 105  CO2 24 24 23  20*  GLUCOSE 104* 113* 103* 112*  BUN 9 10 13 11   CREATININE 0.99 1.05* 1.13* 1.01*  CALCIUM 10.3 9.1 9.2 9.6  ALKPHOS 70 52  --   --   AST 22 19  --   --   ALT 15 12*  --   --   ALBUMIN 4.8 3.8  --   --     Ct Head Wo Contrast  Result Date: 01/19/2017 CLINICAL DATA:  Headache and vomiting for 1 week. EXAM: CT HEAD WITHOUT CONTRAST TECHNIQUE: Contiguous axial images were obtained from the base of the skull through the vertex without intravenous contrast. COMPARISON:  None. FINDINGS: Brain: There is no evidence of acute infarct, intracranial hemorrhage, mass, midline shift, or extra-axial fluid collection. The ventricles and sulci are within normal limits for age. Vascular: Mild carotid siphon calcification.  No hyperdense vessel. Skull: Partially visualized, chronic appearing nasal bone fracture. Sinuses/Orbits: No significant inflammatory disease in the included  paranasal sinuses are mastoid air cells. Unremarkable orbits. Other: None. IMPRESSION: No evidence of acute intracranial abnormality. Unremarkable CT appearance of the brain. Electronically Signed   By: Sebastian Ache M.D.   On: 01/19/2017 17:10    Results/Tests Pending at Time of Discharge: N/A  Discharge Medications:  Allergies as of 01/22/2017      Reactions   Morphine And Related Other (See  Comments)   "Shakes real bad"   Vistaril [hydroxyzine Hcl] Other (See Comments)   Stops breathing      Medication List    STOP taking these medications   azithromycin 250 MG tablet Commonly known as:  ZITHROMAX   levocetirizine 5 MG tablet Commonly known as:  XYZAL   lisinopril 40 MG tablet Commonly known as:  PRINIVIL,ZESTRIL   metaxalone 800 MG tablet Commonly known as:  SKELAXIN   naproxen 500 MG tablet Commonly known as:  NAPROSYN   Oxycodone HCl 20 MG Tabs   tiZANidine 2 MG tablet Commonly known as:  ZANAFLEX   tiZANidine 4 MG tablet Commonly known as:  ZANAFLEX   zolpidem 12.5 MG CR tablet Commonly known as:  AMBIEN CR Replaced by:  zolpidem 5 MG tablet     TAKE these medications   busPIRone 15 MG tablet Commonly known as:  BUSPAR Take 15 mg by mouth 3 (three) times daily.   clonazePAM 0.5 MG tablet Commonly known as:  KLONOPIN Take 1 tablet (0.5 mg total) by mouth daily.   diclofenac 75 MG EC tablet Commonly known as:  VOLTAREN Take 75 mg by mouth 2 (two) times daily as needed. for pain   dicyclomine 20 MG tablet Commonly known as:  BENTYL Take 1 tablet (20 mg total) by mouth every 6 (six) hours as needed for spasms (abdominal cramping).   fluticasone 50 MCG/ACT nasal spray Commonly known as:  FLONASE Place 1 spray into both nostrils daily.   loperamide 2 MG capsule Commonly known as:  IMODIUM Take 1-2 capsules (2-4 mg total) by mouth as needed for diarrhea or loose stools (diarrhea).   methocarbamol 500 MG tablet Commonly known as:  ROBAXIN Take 1 tablet (500 mg total) by mouth every 8 (eight) hours as needed for muscle spasms.   metoprolol tartrate 50 MG tablet Commonly known as:  LOPRESSOR Take 1 tablet (50 mg total) by mouth 2 (two) times daily.   omeprazole 40 MG capsule Commonly known as:  PRILOSEC Take 40 mg by mouth daily as needed (heartburn).   ondansetron 4 MG tablet Commonly known as:  ZOFRAN Take 4 mg by mouth 2 (two)  times daily.   SYMBICORT 160-4.5 MCG/ACT inhaler Generic drug:  budesonide-formoterol Inhale 2 puffs into the lungs 2 (two) times daily as needed for wheezing.   zolpidem 5 MG tablet Commonly known as:  AMBIEN Take 1 tablet (5 mg total) by mouth at bedtime. Replaces:  zolpidem 12.5 MG CR tablet            Discharge Care Instructions        Start     Ordered   01/23/17 0000  clonazePAM (KLONOPIN) 0.5 MG tablet  Daily     01/22/17 1237   01/22/17 0000  dicyclomine (BENTYL) 20 MG tablet  Every 6 hours PRN     01/22/17 1237   01/22/17 0000  loperamide (IMODIUM) 2 MG capsule  As needed     01/22/17 1237   01/22/17 0000  methocarbamol (ROBAXIN) 500 MG tablet  Every 8 hours PRN  01/22/17 1237   01/22/17 0000  metoprolol tartrate (LOPRESSOR) 50 MG tablet  2 times daily     01/22/17 1237   01/22/17 0000  zolpidem (AMBIEN) 5 MG tablet  Daily at bedtime     01/22/17 1237   01/22/17 0000  Increase activity slowly     01/22/17 1237   01/22/17 0000  Diet - low sodium heart healthy     01/22/17 1237      Discharge Instructions: Please refer to Patient Instructions section of EMR for full details.  Patient was counseled important signs and symptoms that should prompt return to medical care, changes in medications, dietary instructions, activity restrictions, and follow up appointments.   Follow-Up Appointments: Follow-up Information    Eldred Manges, NP. Go on 01/26/2017.   Specialty:  Nurse Practitioner Contact information: [address] [place] [address] [phone]           Marthenia Rolling, DO 01/22/2017, 3:07 PM PGY-1, [facility] Family Medicine

## 2017-01-22 NOTE — Discharge Instructions (Signed)
You were admitted to the hospital for dangerously high blood pressure. We have started you on Metoprolol to help with this, we have sent you home with this new medicine (please see above).   For your anxiety we have given you a prescription for Klonopin. You will need to follow up at your PCP to discuss your blood pressure and anxiety.   Please continue to check your blood pressure daily and keep a record.  It was a pleasure caring for you, take care!   Hypertension Hypertension is another name for high blood pressure. High blood pressure forces your heart to work harder to pump blood. This can cause problems over time. There are two numbers in a blood pressure reading. There is a top number (systolic) over a bottom number (diastolic). It is best to have a blood pressure below 120/80. Healthy choices can help lower your blood pressure. You may need medicine to help lower your blood pressure if:  Your blood pressure cannot be lowered with healthy choices.  Your blood pressure is higher than 130/80.  Follow these instructions at home: Eating and drinking  If directed, follow the DASH eating plan. This diet includes: ? Filling half of your plate at each meal with fruits and vegetables. ? Filling one quarter of your plate at each meal with whole grains. Whole grains include whole wheat pasta, brown rice, and whole grain bread. ? Eating or drinking low-fat dairy products, such as skim milk or low-fat yogurt. ? Filling one quarter of your plate at each meal with low-fat (lean) proteins. Low-fat proteins include fish, skinless chicken, eggs, beans, and tofu. ? Avoiding fatty meat, cured and processed meat, or chicken with skin. ? Avoiding premade or processed food.  Eat less than 1,500 mg of salt (sodium) a day.  Limit alcohol use to no more than 1 drink a day for nonpregnant women and 2 drinks a day for men. One drink equals 12 oz of beer, 5 oz of wine, or 1 oz of hard  liquor. Lifestyle  Work with your doctor to stay at a healthy weight or to lose weight. Ask your doctor what the best weight is for you.  Get at least 30 minutes of exercise that causes your heart to beat faster (aerobic exercise) most days of the week. This may include walking, swimming, or biking.  Get at least 30 minutes of exercise that strengthens your muscles (resistance exercise) at least 3 days a week. This may include lifting weights or pilates.  Do not use any products that contain nicotine or tobacco. This includes cigarettes and e-cigarettes. If you need help quitting, ask your doctor.  Check your blood pressure at home as told by your doctor.  Keep all follow-up visits as told by your doctor. This is important. Medicines  Take over-the-counter and prescription medicines only as told by your doctor. Follow directions carefully.  Do not skip doses of blood pressure medicine. The medicine does not work as well if you skip doses. Skipping doses also puts you at risk for problems.  Ask your doctor about side effects or reactions to medicines that you should watch for. Contact a doctor if:  You think you are having a reaction to the medicine you are taking.  You have headaches that keep coming back (recurring).  You feel dizzy.  You have swelling in your ankles.  You have trouble with your vision. Get help right away if:  You get a very bad headache.  You start to  feel confused.  You feel weak or numb.  You feel faint.  You get very bad pain in your: ? Chest. ? Belly (abdomen).  You throw up (vomit) more than once.  You have trouble breathing. Summary  Hypertension is another name for high blood pressure.  Making healthy choices can help lower blood pressure. If your blood pressure cannot be controlled with healthy choices, you may need to take medicine. This information is not intended to replace advice given to you by your health care provider. Make sure  you discuss any questions you have with your health care provider. Document Released: 11/01/2007 Document Revised: 04/12/2016 Document Reviewed: 04/12/2016 Elsevier Interactive Patient Education  [employer].

## 2017-01-22 NOTE — Plan of Care (Signed)
Problem: Food- and Nutrition-Related Knowledge Deficit (NB-1.1) Goal: Nutrition education Formal process to instruct or train a patient/client in a skill or to impart knowledge to help patients/clients voluntarily manage or modify food choices and eating behavior to maintain or improve health. Outcome: Completed/Met Date Met: 01/22/17 Nutrition Education Note  RD consulted for nutrition education regarding a Hypertension.   Lipid Panel  No results found for: CHOL, TRIG, HDL, CHOLHDL, VLDL, LDLCALC  RD provided "Hypertension Nutrition Therapy" handout from the Academy of [employer]etics. Reviewed patient's dietary recall. Provided examples on ways to decrease sodium and fat intake in diet. Discouraged intake of processed foods and use of salt shaker. Encouraged fresh fruits and vegetables as well as whole grain sources of carbohydrates to maximize fiber intake. Teach back method used.  Expect good compliance. Pt knows she needs to make changes, appears ready for change  Body mass index is 24.27 kg/m.   Current diet order is Heart Healthy, patient is consuming approximately 100% of meals at this time, good appetite. Labs and medications reviewed. No further nutrition interventions warranted at this time. RD contact information provided. If additional nutrition issues arise, please re-consult RD.  Kerman Passey MS, RD, LDN [phone] Pager  [phone] Weekend/On-Call Pager

## 2017-01-22 NOTE — Progress Notes (Signed)
Family Medicine Teaching Service Daily Progress Note Intern Pager: [phone]  Patient name: Cynthia Yang Medical record number: [id number] Date of birth: [birth year] Age: 61 y.o. Gender: female  Primary Care Provider: Default, Provider, MD Consultants: none Code Status: FULL  Pt Overview and Major Events to Date:  Cynthia Yang a 61 y.o.femalepresenting with persistent HTN and slowly improving nausea/headaches/vomiting 2weeks after self discontinuing all chronic opioid medications. PMH is significant for spinal stimulator and multiple spine surgeries, HTN, anxiety, chronic pain.  Admitted in HTN urgency with systolic >200.   Overnight 8/26 had temporary symptoms of shakiness that resembled tardive dyskinesia, pharmacy was consulted to review med list and no offending agents found, symptoms resolved by morning.  Assessment and Plan: Cynthia Yang a 62 y.o.femalepresenting with persistent HTN and slowly improving nausea/headaches/vomiting 2weeks after self discontinuing all chronic opioid medications. PMH is significant for spinal stimulator and multiple spine surgeries, HTN, anxiety, chronic pain.  Hypertensive emergency, resolved: BP 170s/60s 8/27. Denies headaches or blurry vision but with worsening kidney function. Patient allergic to norvasc due to leg swelling per her report. S/p nitro gtt on admission for HTN, now on oral meds. - metoprolol 50 mg BID today - consider restarting lisinopril pending improvement in AKI  Anxiety, concern for benzo withdrawal - patient notably anxious in the room today and tremulous, tachycardic, pressured speech. UDS + for benzos on admission. Patient notes she last took home xanax on Wednesday and prior to that she had been taking Xanax 0.25 mg TID. - continue Buspar 15 mg TID - will give Xanax 0.25 mg daily to prevent benzo withdrawal - continue CIWA (last 3)  Elevated creatinine, worsening: Cr increased from 0.99 on admission to  1.13.  Careful balance given HTN. will add very gentle fluids at 50 ml/hr IVNS x8 hours.  - avoid nephrotoxic meds  Anemia, stable : Hgb dropped from 13.5 on admission, 11.7 8/27. All additional cell lines decreased, likely secondary to hemodilution. No clinical signs of bleeding. - FOBT pending   Opioid withdrawal-UDS (-) for opiates, patient stopped 20mg  q4hr cold [place] approximately2wks ago after her pain clinic did not refill her meds when she had a urine + for benoz after being weaned from xanax. Denies any other drug use. -CIWA/COWS scoring 3, 2 overnight  -continue bentyl, imodium, robaxin for symptomatic treatment  Chronic back pain-multiple back surgeries w/ pain formerly managed on high dose opioids -monitor on exam, consider gapapentin  COPD-has only albuterol but has not been using it at home. Hx of additional inhalers prescribed, patient cannot recall nor does she report using any inhalers daily recently.  - albuterol PRN  FEN/GI:loperamide, zofran  Prophylaxis: lovenox  Disposition: continued inpatient management of poorly controlled HTN and AKI  Subjective:  Feels she is doing fine without the pain meds but wants to restart anxiety meds because she thinks she needs those.   Is comfortable following up with pcp for that.  Objective: Temp:  [97.9 F (36.6 C)-99.5 F (37.5 C)] 98.3 F (36.8 C) (08/27 0438) Pulse Rate:  [71-133] 76 (08/27 0438) Resp:  [18-20] 18 (08/27 0438) BP: (154-188)/(62-86) 174/68 (08/27 0438) SpO2:  [97 %-99 %] 98 % (08/27 0438) Weight:  [137 lb (62.1 kg)] 137 lb (62.1 kg) (08/26 2057) Physical Exam: General: visibly anxious, tremulous, otherwise nontoxic appearing Cardiovascular: 3/6 systolic murmur, +tachycardia Respiratory: CTA bil, no W/R/R Abdomen: SNTND, +BS Extremities: no edema, warm  Laboratory:  Recent Labs Lab 01/19/17 1108 01/20/17 0204 01/21/17 0249  WBC 12.4*  8.6 10.6*  HGB 13.5 10.6* 11.7*  HCT 39.1 30.2*  34.0*  PLT 407* 310 358    Recent Labs Lab 01/19/17 1108 01/20/17 0204 01/21/17 0249  NA 137 138 137  K 4.6 3.4* 4.1  CL 100* 106 104  CO2 24 24 23   BUN 9 10 13   CREATININE 0.99 1.05* 1.13*  CALCIUM 10.3 9.1 9.2  PROT 7.8 6.0*  --   BILITOT 0.7 0.6  --   ALKPHOS 70 52  --   ALT 15 12*  --   AST 22 19  --   GLUCOSE 104* 113* 103*    Imaging/Diagnostic Tests: Ct Head Wo Contrast  Result Date: 01/19/2017 CLINICAL DATA:  Headache and vomiting for 1 week. EXAM: CT HEAD WITHOUT CONTRAST TECHNIQUE: Contiguous axial images were obtained from the base of the skull through the vertex without intravenous contrast. COMPARISON:  None. FINDINGS: Brain: There is no evidence of acute infarct, intracranial hemorrhage, mass, midline shift, or extra-axial fluid collection. The ventricles and sulci are within normal limits for age. Vascular: Mild carotid siphon calcification.  No hyperdense vessel. Skull: Partially visualized, chronic appearing nasal bone fracture. Sinuses/Orbits: No significant inflammatory disease in the included paranasal sinuses are mastoid air cells. Unremarkable orbits. Other: None. IMPRESSION: No evidence of acute intracranial abnormality. Unremarkable CT appearance of the brain. Electronically Signed   By: Sebastian Ache M.D.   On: 01/19/2017 17:10     Marthenia Rolling, DO 01/22/2017, 6:23 AM PGY-1, [facility] Family Medicine FPTS Intern pager: [phone], text pages welcome

## 2017-01-22 NOTE — Progress Notes (Signed)
FPTS Interim Progress Note Notified patient has severe tremors in arms and legs. Patient seen and examined at bedside. She has tapping motion in upper and lower extremities bilaterally with anxious affect that may be her baseline. No tachycardia or sweating, no headache.  She is hypertensive.  She asks for ativan to treat her tremor.  O: BP (!) 188/62 (BP Location: Left Arm)   Pulse 71   Temp 97.9 F (36.6 C) (Oral)   Resp 20   Ht 5\' 3"  (1.6 m)   Wt 137 lb (62.1 kg)   SpO2 97%   BMI 24.27 kg/m   GEN: anxious, tremulous CARD: RRR, no m/r/g PULM: CTA bil, no W/R/R Neuro/Psych: + bilateral symmetric tapping tremors in feet and hands bilaterally. AAOx4. Anxious affect.  A/P: Tremor and anxiety - By my scoring ~7 on CIWA. Her tapping foot and hand motions could be tardive dysknesia, however I do not see any obvious culprits on her medication list.  More likely opiate withdrawal (less likely benzo withdrawal as she got 0.5 mg xanax total today.). - reviewed med list with pharmacy; no obvious iatrogenic causes to think of tardive dyskenesia - will give ativan 0.5 mg to treat suspected withdrawal from opiates  - can have PRN hydral for hypertension - continue to monitor - appreciate excellent nursing care  Howard Pouch, MD 01/22/2017, 12:04 AM PGY-2, [facility] Family Medicine Service pager [phone]

## 2018-04-28 HISTORY — PX: ELBOW ARTHROSCOPY: SUR87

## 2018-12-09 ENCOUNTER — Inpatient Hospital Stay (HOSPITAL_COMMUNITY)
Admission: EM | Admit: 2018-12-09 | Discharge: 2018-12-12 | DRG: 202 | Disposition: A | Discharge status: Home / Self Care | Payer: Medicaid - Out of State | Attending: Internal Medicine | Admitting: Internal Medicine

## 2018-12-09 ENCOUNTER — Encounter (HOSPITAL_COMMUNITY): Payer: Self-pay | Admitting: Emergency Medicine

## 2018-12-09 ENCOUNTER — Emergency Department (HOSPITAL_COMMUNITY): Payer: Medicaid - Out of State

## 2018-12-09 ENCOUNTER — Other Ambulatory Visit: Payer: Self-pay

## 2018-12-09 DIAGNOSIS — R0602 Shortness of breath: Secondary | ICD-10-CM

## 2018-12-09 DIAGNOSIS — R0600 Dyspnea, unspecified: Secondary | ICD-10-CM

## 2018-12-09 DIAGNOSIS — I10 Essential (primary) hypertension: Secondary | ICD-10-CM | POA: Diagnosis present

## 2018-12-09 DIAGNOSIS — R7302 Impaired glucose tolerance (oral): Secondary | ICD-10-CM | POA: Diagnosis present

## 2018-12-09 DIAGNOSIS — T380X5A Adverse effect of glucocorticoids and synthetic analogues, initial encounter: Secondary | ICD-10-CM | POA: Diagnosis present

## 2018-12-09 DIAGNOSIS — R739 Hyperglycemia, unspecified: Secondary | ICD-10-CM | POA: Diagnosis present

## 2018-12-09 DIAGNOSIS — J9601 Acute respiratory failure with hypoxia: Secondary | ICD-10-CM | POA: Diagnosis present

## 2018-12-09 DIAGNOSIS — M797 Fibromyalgia: Secondary | ICD-10-CM | POA: Diagnosis present

## 2018-12-09 DIAGNOSIS — F419 Anxiety disorder, unspecified: Secondary | ICD-10-CM | POA: Diagnosis present

## 2018-12-09 DIAGNOSIS — G629 Polyneuropathy, unspecified: Secondary | ICD-10-CM | POA: Diagnosis present

## 2018-12-09 DIAGNOSIS — Z1159 Encounter for screening for other viral diseases: Secondary | ICD-10-CM

## 2018-12-09 DIAGNOSIS — J45901 Unspecified asthma with (acute) exacerbation: Principal | ICD-10-CM | POA: Diagnosis present

## 2018-12-09 DIAGNOSIS — G8929 Other chronic pain: Secondary | ICD-10-CM | POA: Diagnosis present

## 2018-12-09 DIAGNOSIS — K219 Gastro-esophageal reflux disease without esophagitis: Secondary | ICD-10-CM | POA: Diagnosis present

## 2018-12-09 DIAGNOSIS — Z79899 Other long term (current) drug therapy: Secondary | ICD-10-CM

## 2018-12-09 DIAGNOSIS — F329 Major depressive disorder, single episode, unspecified: Secondary | ICD-10-CM | POA: Diagnosis present

## 2018-12-09 DIAGNOSIS — Z7951 Long term (current) use of inhaled steroids: Secondary | ICD-10-CM

## 2018-12-09 DIAGNOSIS — Z23 Encounter for immunization: Secondary | ICD-10-CM

## 2018-12-09 DIAGNOSIS — Z7952 Long term (current) use of systemic steroids: Secondary | ICD-10-CM

## 2018-12-09 DIAGNOSIS — J310 Chronic rhinitis: Secondary | ICD-10-CM | POA: Diagnosis present

## 2018-12-09 HISTORY — DX: Fibromyalgia: M79.7

## 2018-12-09 HISTORY — DX: Unspecified asthma, uncomplicated: J45.909

## 2018-12-09 HISTORY — DX: Anxiety disorder, unspecified: F41.9

## 2018-12-09 LAB — BASIC METABOLIC PANEL
Anion gap: 11 (ref 5–15)
BUN: 11 mg/dL (ref 8–23)
CO2: 22 mmol/L (ref 22–32)
Calcium: 9.1 mg/dL (ref 8.9–10.3)
Chloride: 101 mmol/L (ref 98–111)
Creatinine, Ser: 0.98 mg/dL (ref 0.44–1.00)
GFR calc Af Amer: >60 mL/min (ref >60)
GFR calc non Af Amer: >60 mL/min (ref >60)
Glucose, Bld: 161 mg/dL — ABNORMAL HIGH (ref 70–99)
Potassium: 4.4 mmol/L (ref 3.5–5.1)
Sodium: 134 mmol/L — ABNORMAL LOW (ref 135–145)

## 2018-12-09 LAB — TROPONIN I (HIGH SENSITIVITY)
Troponin I (High Sensitivity): 2 ng/L (ref <18)
Troponin I (High Sensitivity): 2 ng/L (ref <18)

## 2018-12-09 LAB — CBC WITH DIFFERENTIAL/PLATELET
Abs Immature Granulocytes: 0.08 10*3/uL — ABNORMAL HIGH (ref 0.00–0.07)
Basophils Absolute: 0.1 10*3/uL (ref 0.0–0.1)
Basophils Relative: 1 %
Eosinophils Absolute: 0 10*3/uL (ref 0.0–0.5)
Eosinophils Relative: 0 %
HCT: 36 % (ref 36.0–46.0)
Hemoglobin: 12.1 g/dL (ref 12.0–15.0)
Immature Granulocytes: 1 %
Lymphocytes Relative: 9 %
Lymphs Abs: 1 10*3/uL (ref 0.7–4.0)
MCH: 31.3 pg (ref 26.0–34.0)
MCHC: 33.6 g/dL (ref 30.0–36.0)
MCV: 93 fL (ref 80.0–100.0)
Monocytes Absolute: 0.5 10*3/uL (ref 0.1–1.0)
Monocytes Relative: 4 %
Neutro Abs: 9.9 10*3/uL — ABNORMAL HIGH (ref 1.7–7.7)
Neutrophils Relative %: 85 %
Platelets: 274 10*3/uL (ref 150–400)
RBC: 3.87 MIL/uL (ref 3.87–5.11)
RDW: 13.2 % (ref 11.5–15.5)
WBC: 11.6 10*3/uL — ABNORMAL HIGH (ref 4.0–10.5)
nRBC: 0 % (ref 0.0–0.2)

## 2018-12-09 LAB — SARS CORONAVIRUS 2 BY RT PCR (HOSPITAL ORDER, PERFORMED IN ~~LOC~~ HOSPITAL LAB): SARS Coronavirus 2: NEGATIVE

## 2018-12-09 LAB — PHOSPHORUS: Phosphorus: 1.8 mg/dL — ABNORMAL LOW (ref 2.5–4.6)

## 2018-12-09 MED ORDER — IPRATROPIUM BROMIDE 0.02 % IN SOLN
1.0000 mg | Freq: Once | RESPIRATORY_TRACT | Status: AC
  Administered 2018-12-09: 1 mg via RESPIRATORY_TRACT
  Filled 2018-12-09: qty 5

## 2018-12-09 MED ORDER — METOPROLOL TARTRATE 5 MG/5ML IV SOLN
5.0000 mg | Freq: Once | INTRAVENOUS | Status: AC
  Administered 2018-12-09: 5 mg via INTRAVENOUS
  Filled 2018-12-09: qty 5

## 2018-12-09 MED ORDER — ALBUTEROL (5 MG/ML) CONTINUOUS INHALATION SOLN
10.0000 mg/h | INHALATION_SOLUTION | Freq: Once | RESPIRATORY_TRACT | Status: AC
  Administered 2018-12-09: 10 mg/h via RESPIRATORY_TRACT
  Filled 2018-12-09: qty 20

## 2018-12-09 MED ORDER — ACETAMINOPHEN 650 MG RE SUPP: 650.0000 mg | Freq: Four times a day (QID) | RECTAL | Status: DC | PRN

## 2018-12-09 MED ORDER — IPRATROPIUM BROMIDE 0.02 % IN SOLN
0.5000 mg | Freq: Four times a day (QID) | RESPIRATORY_TRACT | Status: DC
  Administered 2018-12-09 – 2018-12-10 (×5): 0.5 mg via RESPIRATORY_TRACT
  Filled 2018-12-09 (×5): qty 2.5

## 2018-12-09 MED ORDER — TIZANIDINE HCL 2 MG PO TABS
4.0000 mg | ORAL_TABLET | Freq: Four times a day (QID) | ORAL | Status: DC
  Administered 2018-12-10 – 2018-12-12 (×11): 4 mg via ORAL
  Filled 2018-12-09 (×17): qty 2

## 2018-12-09 MED ORDER — METOPROLOL TARTRATE 50 MG PO TABS
50.0000 mg | ORAL_TABLET | Freq: Two times a day (BID) | ORAL | Status: DC
  Administered 2018-12-10 – 2018-12-12 (×6): 50 mg via ORAL
  Filled 2018-12-09 (×6): qty 1

## 2018-12-09 MED ORDER — ALBUTEROL SULFATE (2.5 MG/3ML) 0.083% IN NEBU: 5.0000 mg | INHALATION_SOLUTION | Freq: Once | RESPIRATORY_TRACT | Status: DC

## 2018-12-09 MED ORDER — METHYLPREDNISOLONE SODIUM SUCC 125 MG IJ SOLR
125.0000 mg | Freq: Once | INTRAMUSCULAR | Status: AC
  Administered 2018-12-09: 125 mg via INTRAVENOUS
  Filled 2018-12-09: qty 2

## 2018-12-09 MED ORDER — FLUTICASONE PROPIONATE 50 MCG/ACT NA SUSP
1.0000 | Freq: Every day | NASAL | Status: DC
  Administered 2018-12-10 – 2018-12-12 (×3): 1 via NASAL
  Filled 2018-12-09 (×3): qty 16

## 2018-12-09 MED ORDER — IOHEXOL 350 MG/ML SOLN
100.0000 mL | Freq: Once | INTRAVENOUS | Status: AC | PRN
  Administered 2018-12-09: 100 mL via INTRAVENOUS

## 2018-12-09 MED ORDER — CLONAZEPAM 0.5 MG PO TABS
1.0000 mg | ORAL_TABLET | Freq: Every day | ORAL | Status: DC
  Administered 2018-12-10 – 2018-12-11 (×3): 1 mg via ORAL
  Filled 2018-12-09 (×3): qty 2

## 2018-12-09 MED ORDER — CETIRIZINE HCL 10 MG PO TABS
5.0000 mg | ORAL_TABLET | Freq: Every day | ORAL | Status: DC
  Filled 2018-12-09 (×2): qty 1

## 2018-12-09 MED ORDER — LORAZEPAM 2 MG/ML IJ SOLN
0.5000 mg | Freq: Once | INTRAMUSCULAR | Status: AC
  Administered 2018-12-09: 0.5 mg via INTRAVENOUS
  Filled 2018-12-09: qty 1

## 2018-12-09 MED ORDER — LEVALBUTEROL HCL 0.63 MG/3ML IN NEBU
0.6300 mg | INHALATION_SOLUTION | Freq: Four times a day (QID) | RESPIRATORY_TRACT | Status: DC
  Administered 2018-12-09 – 2018-12-10 (×5): 0.63 mg via RESPIRATORY_TRACT
  Filled 2018-12-09 (×5): qty 3

## 2018-12-09 MED ORDER — MAGNESIUM SULFATE 2 GM/50ML IV SOLN
2.0000 g | Freq: Once | INTRAVENOUS | Status: AC
  Administered 2018-12-09: 2 g via INTRAVENOUS
  Filled 2018-12-09: qty 50

## 2018-12-09 MED ORDER — AMITRIPTYLINE HCL 25 MG PO TABS
75.0000 mg | ORAL_TABLET | Freq: Every day | ORAL | Status: DC
  Administered 2018-12-10 – 2018-12-11 (×3): 75 mg via ORAL
  Filled 2018-12-09: qty 1
  Filled 2018-12-09 (×3): qty 3
  Filled 2018-12-09: qty 1

## 2018-12-09 MED ORDER — GABAPENTIN 400 MG PO CAPS
400.0000 mg | ORAL_CAPSULE | Freq: Three times a day (TID) | ORAL | Status: DC
  Administered 2018-12-10 – 2018-12-12 (×7): 400 mg via ORAL
  Filled 2018-12-09 (×8): qty 1

## 2018-12-09 MED ORDER — ENOXAPARIN SODIUM 40 MG/0.4ML ~~LOC~~ SOLN
40.0000 mg | SUBCUTANEOUS | Status: DC
  Administered 2018-12-10 – 2018-12-11 (×3): 40 mg via SUBCUTANEOUS
  Filled 2018-12-09 (×3): qty 0.4

## 2018-12-09 MED ORDER — ACETAMINOPHEN 325 MG PO TABS: 650.0000 mg | ORAL_TABLET | Freq: Four times a day (QID) | ORAL | Status: DC | PRN

## 2018-12-09 NOTE — ED Notes (Signed)
Patient complains of Chronic bronchitis, non productive cough, and history of asthma.

## 2018-12-09 NOTE — ED Notes (Signed)
Patient transported to CT 

## 2018-12-09 NOTE — ED Notes (Signed)
Patient complaining of shortness of breath. Oxygen sats were 95 on room air. Placed on 2L [place] for comfort. Heart rate is still elevated and she is complaining of weakness. EDP made aware.

## 2018-12-09 NOTE — ED Notes (Signed)
RT contacted for neb treatment.

## 2018-12-09 NOTE — ED Triage Notes (Signed)
Patient has had increased shortness of breath since Thursday, prescribed Prednisone and took breathing treatment about 1 hour ago without any relief.

## 2018-12-09 NOTE — H&P (Signed)
History and Physical    Cynthia Yang ZHY:[id number] DOB: March 01, 1956 DOA: 12/09/2018  PCP: Catha NottinghamKeith, Amanda L, FNP   Patient coming from: Home.  I have personally briefly reviewed patient's old medical records in [facility]  Chief Complaint: Shortness of breath.  HPI: Cynthia Yang is a 63 y.o. female with medical history significant of anxiety, asthma, fibromyalgia, GERD, hypertension who is coming to the emergency department due to progressively worse dyspnea associated with wheezing, dry cough with chest wall tenderness and mild sore throat since Thursday which has not responded to oral prednisone and bronchodilators at home.  She denies fever, chills, rhinorrhea, hemoptysis, nausea or emesis, dizziness, PND, orthopnea or pitting edema of the lower extremities.  Has some abdominal wall tenderness from coughing, but denies visceral abdominal pain, diarrhea, constipation, melena or hematochezia.  No dysuria, frequency or hematuria.  Denies polyuria, polydipsia, polyphagia or blurred vision.  ED Course: Her initial vital signs were Temperature 97.8 F, pulse 87, respirations 24, blood pressure 181/78 mmHg and O2 sat 97% on room air.  She received supplemental oxygen, bronchodilators, magnesium sulfate, Solu-Medrol and anxiolytics in the emergency department.  I added metoprolol 5 mg IVP x1 dose for tachycardia.  White count is 11.6, hemoglobin 12.1 g/dL and platelets [id number].  Sodium 134 mmol/L, phosphorus 1.8 and glucose 161 mg/dL.  The rest of the electrolytes and renal function were normal.  EKG was nonischemic.Her chest radiograph did not show any active disease.  Review of Systems: As per HPI otherwise 10 point review of systems negative.   Past Medical History:  Diagnosis Date   Anxiety    Asthma    Fibromyalgia    GERD (gastroesophageal reflux disease)    Hypertension     Past Surgical History:  Procedure Laterality Date   ELBOW ARTHROSCOPY Left 04/2018   NECK  SURGERY       reports that she has never smoked. She has never used smokeless tobacco. She reports current drug use. Drug: Benzodiazepines. She reports that she does not drink alcohol.  Allergies  Allergen Reactions   Morphine And Related Other (See Comments)    "Shakes real bad"   Trazodone And Nefazodone     Fast heart rate   Vistaril [Hydroxyzine Hcl] Other (See Comments)    Stops breathing   Family History  Problem Relation Age of Onset   Lung cancer Mother    Prior to Admission medications   Medication Sig Start Date End Date Taking? Authorizing Provider  albuterol (PROVENTIL) (2.5 MG/3ML) 0.083% nebulizer solution Take 2.5 mg by nebulization every 6 (six) hours as needed for wheezing or shortness of breath.   Yes [provider]  albuterol (VENTOLIN HFA) 108 (90 Base) MCG/ACT inhaler Inhale 2 puffs into the lungs every 4 (four) hours as needed for wheezing or shortness of breath.   Yes [provider]  amitriptyline (ELAVIL) 75 MG tablet Take 75 mg by mouth at bedtime.   Yes [provider]  clonazePAM (KLONOPIN) 0.5 MG tablet Take 1 tablet (0.5 mg total) by mouth daily. Patient taking differently: Take 1 mg by mouth at bedtime.  01/23/17 12/09/18 Yes Beaulah DinningGambino, Christina M, MD  fluticasone (FLONASE) 50 MCG/ACT nasal spray Place 1 spray into both nostrils daily. 01/06/17  Yes [provider]  gabapentin (NEURONTIN) 400 MG capsule Take 400 mg by mouth 3 (three) times daily.   Yes [provider]  levocetirizine (XYZAL) 5 MG tablet Take 1 tablet by mouth at bedtime.  Yes [provider]  metoprolol tartrate (LOPRESSOR) 50 MG tablet Take 1 tablet (50 mg total) by mouth 2 (two) times daily. Patient taking differently: Take 100 mg by mouth 2 (two) times daily.  01/22/17  Yes Beaulah DinningGambino, Christina M, MD  predniSONE (DELTASONE) 20 MG tablet Take 1 tablet by mouth daily. 12/05/18 12/15/18 Yes [provider]  SYMBICORT 160-4.5  MCG/ACT inhaler Inhale 2 puffs into the lungs 2 (two) times daily as needed for wheezing. 11/11/16  Yes [provider]  tiZANidine (ZANAFLEX) 4 MG capsule Take 1 tablet by mouth every 6 (six) hours.   Yes [provider]  dicyclomine (BENTYL) 20 MG tablet Take 1 tablet (20 mg total) by mouth every 6 (six) hours as needed for spasms (abdominal cramping). Patient not taking: Reported on 12/09/2018 01/22/17   Beaulah DinningGambino, Christina M, MD  loperamide (IMODIUM) 2 MG capsule Take 1-2 capsules (2-4 mg total) by mouth as needed for diarrhea or loose stools (diarrhea). Patient not taking: Reported on 12/09/2018 01/22/17   Beaulah DinningGambino, Christina M, MD  methocarbamol (ROBAXIN) 500 MG tablet Take 1 tablet (500 mg total) by mouth every 8 (eight) hours as needed for muscle spasms. Patient not taking: Reported on 12/09/2018 01/22/17   Beaulah DinningGambino, Christina M, MD  zolpidem (AMBIEN) 5 MG tablet Take 1 tablet (5 mg total) by mouth at bedtime. Patient not taking: Reported on 12/09/2018 01/22/17   Beaulah DinningGambino, Christina M, MD    Physical Exam: Vitals:   12/09/18 2000 12/09/18 2021 12/09/18 2030 12/09/18 2100  BP: (!) 157/98  (!) 163/74 (!) 144/70  Pulse: (!) 112  (!) 113 (!) 113  Resp: (!) 27  (!) 25 (!) 24  Temp:      TempSrc:      SpO2: 96% 96% 98% 96%  Weight:      Height:        Constitutional: Mildly anxious, but otherwise calm, comfortable Eyes: PERRL, lids and conjunctivae are mildly injected. ENMT: Mucous membranes are moist. Posterior pharynx clear of any exudate or lesions. Neck: normal, supple, no masses, no thyromegaly Respiratory: Absent sounds on bases, decreased breath sounds on upper lung fields with bilateral wheezing, no crackles. Normal respiratory effort. No accessory muscle use.  Cardiovascular: Tachycardic at 112 bpm with a regular rhythm, no murmurs / rubs / gallops. No extremity edema. 2+ pedal pulses. No carotid bruits.  Abdomen: Soft, no tenderness, no masses palpated. No  hepatosplenomegaly. Bowel sounds positive.  Musculoskeletal: no clubbing / cyanosis.  Good ROM, no contractures. Normal muscle tone.  Skin: no significant rashes, lesions, ulcers on limited dermatological examination. Neurologic: CN 2-12 grossly intact. Sensation intact, DTR normal. Strength 5/5 in all 4.  Psychiatric: Normal judgment and insight. Alert and oriented x 3.  Mildly anxious mood.   Labs on Admission: I have personally reviewed following labs and imaging studies  CBC: Recent Labs  Lab 12/09/18 1256  WBC 11.6*  NEUTROABS 9.9*  HGB 12.1  HCT 36.0  MCV 93.0  PLT 274   Basic Metabolic Panel: Recent Labs  Lab 12/09/18 1256  NA 134*  K 4.4  CL 101  CO2 22  GLUCOSE 161*  BUN 11  CREATININE 0.98  CALCIUM 9.1   GFR: Estimated Creatinine Clearance: 59.7 mL/min (by C-G formula based on SCr of 0.98 mg/dL). Liver Function Tests: No results for input(s): AST, ALT, ALKPHOS, BILITOT, PROT, ALBUMIN in the last 168 hours. No results for input(s): LIPASE, AMYLASE in the last 168 hours. No results for input(s): AMMONIA in  the last 168 hours. Coagulation Profile: No results for input(s): INR, PROTIME in the last 168 hours. Cardiac Enzymes: No results for input(s): CKTOTAL, CKMB, CKMBINDEX, TROPONINI in the last 168 hours. BNP (last 3 results) No results for input(s): PROBNP in the last 8760 hours. HbA1C: No results for input(s): HGBA1C in the last 72 hours. CBG: No results for input(s): GLUCAP in the last 168 hours. Lipid Profile: No results for input(s): CHOL, HDL, LDLCALC, TRIG, CHOLHDL, LDLDIRECT in the last 72 hours. Thyroid Function Tests: No results for input(s): TSH, T4TOTAL, FREET4, T3FREE, THYROIDAB in the last 72 hours. Anemia Panel: No results for input(s): VITAMINB12, FOLATE, FERRITIN, TIBC, IRON, RETICCTPCT in the last 72 hours. Urine analysis: No results found for: COLORURINE, APPEARANCEUR, [place], [place], GLUCOSEU, HGBUR, BILIRUBINUR, KETONESUR,  PROTEINUR, UROBILINOGEN, NITRITE, LEUKOCYTESUR  Radiological Exams on Admission: Ct Angio Chest Pe W And/or Wo Contrast  Result Date: 12/09/2018 CLINICAL DATA:  Increased shortness of breath since Thursday, mid chest tightness today. EXAM: CT ANGIOGRAPHY CHEST WITH CONTRAST TECHNIQUE: Multidetector CT imaging of the chest was performed using the standard protocol during bolus administration of intravenous contrast. Multiplanar CT image reconstructions and MIPs were obtained to evaluate the vascular anatomy. CONTRAST:  165mL OMNIPAQUE IOHEXOL 350 MG/ML SOLN COMPARISON:  None. FINDINGS: Cardiovascular: Some of the most peripheral segmental and subsegmental pulmonary artery branches are difficult to definitively characterize due to mild patient breathing motion artifact, however, there is no pulmonary embolism identified within the main, lobar or central segmental pulmonary arteries bilaterally. No thoracic aortic aneurysm or evidence of aortic dissection. Heart size is within normal limits. No pericardial effusion seen. Mediastinum/Nodes: No mass or enlarged lymph nodes appreciated within the mediastinum or perihilar regions. Esophagus is unremarkable. Trachea and central bronchi are unremarkable. Lungs/Pleura: Lungs are clear. No pleural effusion or pneumothorax. Upper Abdomen: Limited images of the upper abdomen are unremarkable. Musculoskeletal: No acute or suspicious osseous finding mild degenerative spurring at multiple levels of the slightly scoliotic thoracic spine. Stimulator device within the thoracic canal. No chest wall abnormality identified. Review of the MIP images confirms the above findings. IMPRESSION: No acute findings. No pulmonary embolism seen, with mild study limitations detailed above. No aortic aneurysm or evidence of aortic dissection. No pneumonia or pulmonary edema. Electronically Signed   By: Franki Cabot M.D.   On: 12/09/2018 18:29   Dg Chest Port 1 View  Result Date:  12/09/2018 CLINICAL DATA:  Shortness of breath EXAM: PORTABLE CHEST 1 VIEW COMPARISON:  11/19/2018 FINDINGS: Normal heart size and mediastinal contours. There is no edema, consolidation, effusion, or pneumothorax. Dorsal column stimulator leads. IMPRESSION: No evidence of active disease. Electronically Signed   By: Monte Fantasia M.D.   On: 12/09/2018 12:01   EKG: Independently reviewed. Vent. rate 87 BPM PR interval * ms QRS duration 99 ms QT/QTc 364/438 ms P-R-T axes 46 36 -71 Sinus rhythm Borderline repolarization abnormality Baseline wander in lead(s) V6  Assessment/Plan Principal Problem:   Asthma exacerbation Observation/stepdown. Continue supplemental oxygen. BiPAP ventilation mode as needed. Continue cyclic 5 mg p.o. bedtime. Xopenex plus ipratropium nebs every 6 hours. Continue Solu-Medrol 40 mg IVP every 6 hours.  Active Problems:   Hypertension Continue metoprolol 50 mg p.o. twice daily. Monitor blood pressure and heart rate.    GERD (gastroesophageal reflux disease) Protonix 40 mg p.o. daily.    Anxiety Continue amitriptyline 75 mg p.o. bedtime. Continue clonazepam 0.5 mg p.o. at bedtime. Anxiolytics as needed.    Hyperglycemia Likely due to glucocorticoids. Follow-up glucose level.  Consider CBG monitoring if significant.   DVT prophylaxis: Lovenox SQ. Code Status: Full code. Family Communication: Disposition Plan: 24 to 48 hours duration for asthma is observation treatment. Consults called:  Admission status: Observation/stepdown.   Bobette Moavid Manuel Matisyn Cabeza MD [facility]  12/09/2018, 9:51 PM   This document was prepared using Dragon voice recognition software and may contain some unintended transcription errors.

## 2018-12-09 NOTE — ED Notes (Signed)
Pt ambulated well to the bathroom and back. O2 sats stayed in between 95 and 99%.

## 2018-12-09 NOTE — ED Provider Notes (Signed)
[facility] Provider Note   CSN: [id number] Arrival date & time: 12/09/18  1018     History   Chief Complaint Chief Complaint  Patient presents with  . Shortness of Breath    HPI Cynthia Yang is a 63 y.o. female.     HPI  Pt was seen at 1155.  Per pt, c/o gradual onset and worsening of persistent cough, wheezing and SOB for the past 4 days.  Describes her symptoms as "my asthma is acting up." Has been associated with constant generalized chest "tightness" for the past 2 to 3 days.  Has been using home MDI and nebs without relief. Pt states her has been evaluated by OSH and PMD for these recurrent symptoms over the past 2 months, rx prednisone, antibiotic without improvement.  Denies CP/palpitations, no back pain, no abd pain, no N/V/D, no fevers, no rash.     Past Medical History:  Diagnosis Date  . Anxiety   . Asthma   . Fibromyalgia   . GERD (gastroesophageal reflux disease)   . Hypertension     Patient Active Problem List   Diagnosis Date Noted  . Intractable vomiting with nausea   . Hypertensive urgency   . AKI (acute kidney injury) ([place])   . Hypokalemia   . Opioid use with withdrawal ([place])   . Hypertensive emergency 01/19/2017  . Acute nonintractable headache   . Opioid withdrawal [facility])     Past Surgical History:  Procedure Laterality Date  . ELBOW ARTHROSCOPY Left 04/2018  . NECK SURGERY       OB History   No obstetric history on file.      Home Medications    Prior to Admission medications   Medication Sig Start Date End Date Taking? Authorizing Provider  metoprolol tartrate (LOPRESSOR) 50 MG tablet Take 1 tablet (50 mg total) by mouth 2 (two) times daily. Patient taking differently: Take 100 mg by mouth 2 (two) times daily.  01/22/17  Yes Carlyle Dolly, MD  SYMBICORT 160-4.5 MCG/ACT inhaler Inhale 2 puffs into the lungs 2 (two) times daily as needed for wheezing. 11/11/16  Yes [provider]  tiZANidine  (ZANAFLEX) 4 MG capsule Take 1 tablet by mouth every 6 (six) hours.   Yes [provider]  busPIRone (BUSPAR) 15 MG tablet Take 15 mg by mouth 3 (three) times daily. 01/06/17   [provider]  clonazePAM (KLONOPIN) 0.5 MG tablet Take 1 tablet (0.5 mg total) by mouth daily. 01/23/17 01/30/17  Carlyle Dolly, MD  diclofenac (VOLTAREN) 75 MG EC tablet Take 75 mg by mouth 2 (two) times daily as needed. for pain 01/08/17   [provider]  dicyclomine (BENTYL) 20 MG tablet Take 1 tablet (20 mg total) by mouth every 6 (six) hours as needed for spasms (abdominal cramping). 01/22/17   Carlyle Dolly, MD  fluticasone (FLONASE) 50 MCG/ACT nasal spray Place 1 spray into both nostrils daily. 01/06/17   [provider]  loperamide (IMODIUM) 2 MG capsule Take 1-2 capsules (2-4 mg total) by mouth as needed for diarrhea or loose stools (diarrhea). 01/22/17   Carlyle Dolly, MD  methocarbamol (ROBAXIN) 500 MG tablet Take 1 tablet (500 mg total) by mouth every 8 (eight) hours as needed for muscle spasms. 01/22/17   Carlyle Dolly, MD  omeprazole (PRILOSEC) 40 MG capsule Take 40 mg by mouth daily as needed (heartburn).     [provider]  ondansetron (ZOFRAN) 4 MG tablet  Take 4 mg by mouth 2 (two) times daily. 01/09/17   [provider]  zolpidem (AMBIEN) 5 MG tablet Take 1 tablet (5 mg total) by mouth at bedtime. 01/22/17   Beaulah DinningGambino, Christina M, MD    Family History History reviewed. No pertinent family history.  Social History Social History   Tobacco Use  . Smoking status: Never Smoker  . Smokeless tobacco: Never Used  Substance Use Topics  . Alcohol use: No  . Drug use: Yes    Types: Benzodiazepines    Comment: stopped taking 2 weeks ago     Allergies   Morphine and related, Trazodone and nefazodone, and Vistaril [hydroxyzine hcl]   Review of Systems Review of Systems ROS: Statement: All systems negative except as marked or  noted in the HPI; Constitutional: Negative for fever and chills. ; ; Eyes: Negative for eye pain, redness and discharge. ; ; ENMT: Negative for ear pain, hoarseness, nasal congestion, sinus pressure and sore throat. ; ; Cardiovascular: +CP. Negative for palpitations, diaphoresis, and peripheral edema. ; ; Respiratory: +cough, wheezing, SOB. Negative for stridor. ; ; Gastrointestinal: Negative for nausea, vomiting, diarrhea, abdominal pain, blood in stool, hematemesis, jaundice and rectal bleeding. . ; ; Genitourinary: Negative for dysuria, flank pain and hematuria. ; ; Musculoskeletal: Negative for back pain and neck pain. Negative for swelling and trauma.; ; Skin: Negative for pruritus, rash, abrasions, blisters, bruising and skin lesion.; ; Neuro: Negative for headache, lightheadedness and neck stiffness. Negative for weakness, altered level of consciousness, altered mental status, extremity weakness, paresthesias, involuntary movement, seizure and syncope.       Physical Exam Updated Vital Signs BP (!) 145/61   Pulse (!) 133   Temp 97.8 F (36.6 C) (Oral)   Resp (!) 27   Ht 5\' 4"  (1.626 m)   Wt 78.9 kg   SpO2 99%   BMI 29.87 kg/m    Patient Vitals for the past 24 hrs:  BP Temp Temp src Pulse Resp SpO2 Height Weight  12/09/18 1400 (!) 145/61 - - (!) 133 (!) 27 99 % - -  12/09/18 1330 (!) 129/56 - - 80 (!) 21 99 % - -  12/09/18 1316 - - - - - 97 % - -  12/09/18 1309 140/65 - - 71 (!) 24 96 % - -  12/09/18 1230 (!) 141/63 - - 71 (!) 21 97 % - -  12/09/18 1200 (!) 140/54 - - 72 19 99 % - -  12/09/18 1130 (!) 133/59 - - 73 19 97 % - -  12/09/18 1100 132/83 - - 74 (!) 21 97 % - -  12/09/18 1039 - - - - - 98 % - -  12/09/18 1035 - - - - - 99 % - -  12/09/18 1034 (!) 181/78 97.8 F (36.6 C) Oral 87 (!) 24 97 % 5\' 4"  (1.626 m) 78.9 kg     Physical Exam 1200: Physical examination:  Nursing notes reviewed; Vital signs and O2 SAT reviewed;  Constitutional: Well developed, Well nourished,  Well hydrated, Uncomfortable appearing.;; Head:  Normocephalic, atraumatic; Eyes: EOMI, PERRL, No scleral icterus; ENMT: Mouth and pharynx normal, Mucous membranes moist; Neck: Supple, Full range of motion, No lymphadenopathy; Cardiovascular: Tachycardic rate and rhythm, No gallop; Respiratory: Breath sounds diminished & equal bilaterally, insp/exp wheezes bilat. Faint audible wheezing.  Speaking short sentences, sitting upright, tachypneic.; Chest: Nontender, Movement normal; Abdomen: Soft, Nontender, Nondistended, Normal bowel sounds; Genitourinary: No CVA tenderness; Extremities: Pulses normal, No tenderness, No  edema, No calf edema or asymmetry.; Neuro: AA&Ox3, Major CN grossly intact.  Speech clear. No gross focal motor or sensory deficits in extremities.; Skin: Color normal, Warm, Dry.    ED Treatments / Results  Labs (all labs ordered are listed, but only abnormal results are displayed)   EKG EKG Interpretation  Date/Time:  Monday December 09 2018 10:33:32 EDT Ventricular Rate:  87 PR Interval:    QRS Duration: 99 QT Interval:  364 QTC Calculation: 438 R Axis:   36 Text Interpretation:  Sinus rhythm Borderline repolarization abnormality Baseline wander When compared with ECG of 01/21/2017 Baseline wander is now Present Nonspecific ST and T wave abnormality is now Present Confirmed by Samuel JesterMcManus, Kevan Prouty [phone]([id number]) on 12/09/2018 12:29:22 PM   Radiology   Procedures Procedures (including critical care time)  Medications Ordered in ED Medications  methylPREDNISolone sodium succinate (SOLU-MEDROL) 125 mg/2 mL injection 125 mg (125 mg Intravenous Given 12/09/18 1310)  albuterol (PROVENTIL,VENTOLIN) solution continuous neb (10 mg/hr Nebulization Given 12/09/18 1316)  ipratropium (ATROVENT) nebulizer solution 1 mg (1 mg Nebulization Given 12/09/18 1315)     Initial Impression / Assessment and Plan / ED Course  I have reviewed the triage vital signs and the nursing notes.  Pertinent labs &  imaging results that were available during my care of the patient were reviewed by me and considered in my medical decision making (see chart for details).     MDM Reviewed: previous chart, nursing note and vitals Reviewed previous: labs and ECG Interpretation: labs, ECG and x-ray Total time providing critical care: 30-74 minutes. This excludes time spent performing separately reportable procedures and services.    CRITICAL CARE Performed by: Samuel JesterKathleen Ceara Wrightson Total critical care time: 35 minutes Critical care time was exclusive of separately billable procedures and treating other patients. Critical care was necessary to treat or prevent imminent or life-threatening deterioration. Critical care was time spent personally by me on the following activities: development of treatment plan with patient and/or surrogate as well as nursing, discussions with consultants, evaluation of patient's response to treatment, examination of patient, obtaining history from patient or surrogate, ordering and performing treatments and interventions, ordering and review of laboratory studies, ordering and review of radiographic studies, pulse oximetry and re-evaluation of patient's condition.'    Results for orders placed or performed during the hospital encounter of 12/09/18  SARS Coronavirus 2 (CEPHEID - Performed in [facility] hospital lab), [facility] Order   Specimen: Nasopharyngeal Swab  Result Value Ref Range   SARS Coronavirus 2 NEGATIVE NEGATIVE  Basic metabolic panel  Result Value Ref Range   Sodium 134 (L) 135 - 145 mmol/L   Potassium 4.4 3.5 - 5.1 mmol/L   Chloride 101 98 - 111 mmol/L   CO2 22 22 - 32 mmol/L   Glucose, Bld 161 (H) 70 - 99 mg/dL   BUN 11 8 - 23 mg/dL   Creatinine, Ser 4.780.98 0.44 - 1.00 mg/dL   Calcium 9.1 8.9 - 29.510.3 mg/dL   GFR calc non Af Amer >60 >60 mL/min   GFR calc Af Amer >60 >60 mL/min   Anion gap 11 5 - 15  CBC with Differential  Result Value Ref Range   WBC 11.6 (H)  4.0 - 10.5 K/uL   RBC 3.87 3.87 - 5.11 MIL/uL   Hemoglobin 12.1 12.0 - 15.0 g/dL   HCT 62.136.0 30.836.0 - 65.746.0 %   MCV 93.0 80.0 - 100.0 fL   MCH 31.3 26.0 - 34.0 pg   MCHC 33.6  30.0 - 36.0 g/dL   RDW 16.113.2 09.611.5 - 04.515.5 %   Platelets 274 150 - 400 K/uL   nRBC 0.0 0.0 - 0.2 %   Neutrophils Relative % 85 %   Neutro Abs 9.9 (H) 1.7 - 7.7 K/uL   Lymphocytes Relative 9 %   Lymphs Abs 1.0 0.7 - 4.0 K/uL   Monocytes Relative 4 %   Monocytes Absolute 0.5 0.1 - 1.0 K/uL   Eosinophils Relative 0 %   Eosinophils Absolute 0.0 0.0 - 0.5 K/uL   Basophils Relative 1 %   Basophils Absolute 0.1 0.0 - 0.1 K/uL   Immature Granulocytes 1 %   Abs Immature Granulocytes 0.08 (H) 0.00 - 0.07 K/uL  Troponin I (High Sensitivity)  Result Value Ref Range   Troponin I (High Sensitivity) 2.00 <18 ng/L   Dg Chest Port 1 View Result Date: 12/09/2018 CLINICAL DATA:  Shortness of breath EXAM: PORTABLE CHEST 1 VIEW COMPARISON:  11/19/2018 FINDINGS: Normal heart size and mediastinal contours. There is no edema, consolidation, effusion, or pneumothorax. Dorsal column stimulator leads. IMPRESSION: No evidence of active disease. Electronically Signed   By: Marnee SpringJonathon  Watts M.D.   On: 12/09/2018 12:01   Cynthia Sacramentoatricia K Pafford was evaluated in Emergency Department on 12/09/2018 for the symptoms described in the history of present illness. She was evaluated in the context of the global COVID-19 pandemic, which necessitated consideration that the patient might be at risk for infection with the SARS-CoV-2 virus that causes COVID-19. Institutional protocols and algorithms that pertain to the evaluation of patients at risk for COVID-19 are in a state of rapid change based on information released by regulatory bodies including the CDC and federal and state organizations. These policies and algorithms were followed during the patient's care in the ED.     1505:  Pt states she "feels better" after hour long neb and steroid.  NAD, lungs CTA bilat,  no wheezing, resps easy, speaking full sentences, Sats 100% R/A.  Pt ambulated around the ED with Sats remaining 95-99 % R/A, resps easy, NAD.  Pt is very tremulous after hour long neb, with HR in 130's. 2nd troponin pending. Pt will need improved HR before dispo. Pt agreeable with plan. Sign out to Dr. Estell HarpinZammit.      Final Clinical Impressions(s) / ED Diagnoses   Final diagnoses:  SOB (shortness of breath)    ED Discharge Orders    None       Samuel JesterMcManus, Aadhya Bustamante, DO 12/09/18 1509

## 2018-12-10 DIAGNOSIS — J9601 Acute respiratory failure with hypoxia: Secondary | ICD-10-CM | POA: Diagnosis present

## 2018-12-10 DIAGNOSIS — F329 Major depressive disorder, single episode, unspecified: Secondary | ICD-10-CM | POA: Diagnosis present

## 2018-12-10 DIAGNOSIS — R739 Hyperglycemia, unspecified: Secondary | ICD-10-CM | POA: Diagnosis present

## 2018-12-10 DIAGNOSIS — I10 Essential (primary) hypertension: Secondary | ICD-10-CM | POA: Diagnosis present

## 2018-12-10 DIAGNOSIS — F419 Anxiety disorder, unspecified: Secondary | ICD-10-CM | POA: Diagnosis present

## 2018-12-10 DIAGNOSIS — Z7952 Long term (current) use of systemic steroids: Secondary | ICD-10-CM | POA: Diagnosis not present

## 2018-12-10 DIAGNOSIS — R0602 Shortness of breath: Secondary | ICD-10-CM

## 2018-12-10 DIAGNOSIS — Z1159 Encounter for screening for other viral diseases: Secondary | ICD-10-CM | POA: Diagnosis not present

## 2018-12-10 DIAGNOSIS — J310 Chronic rhinitis: Secondary | ICD-10-CM | POA: Diagnosis present

## 2018-12-10 DIAGNOSIS — G8929 Other chronic pain: Secondary | ICD-10-CM | POA: Diagnosis present

## 2018-12-10 DIAGNOSIS — Z79899 Other long term (current) drug therapy: Secondary | ICD-10-CM | POA: Diagnosis not present

## 2018-12-10 DIAGNOSIS — M797 Fibromyalgia: Secondary | ICD-10-CM | POA: Diagnosis present

## 2018-12-10 DIAGNOSIS — R7302 Impaired glucose tolerance (oral): Secondary | ICD-10-CM | POA: Diagnosis present

## 2018-12-10 DIAGNOSIS — K219 Gastro-esophageal reflux disease without esophagitis: Secondary | ICD-10-CM

## 2018-12-10 DIAGNOSIS — Z7951 Long term (current) use of inhaled steroids: Secondary | ICD-10-CM | POA: Diagnosis not present

## 2018-12-10 DIAGNOSIS — G629 Polyneuropathy, unspecified: Secondary | ICD-10-CM | POA: Diagnosis present

## 2018-12-10 DIAGNOSIS — Z23 Encounter for immunization: Secondary | ICD-10-CM | POA: Diagnosis not present

## 2018-12-10 DIAGNOSIS — T380X5A Adverse effect of glucocorticoids and synthetic analogues, initial encounter: Secondary | ICD-10-CM | POA: Diagnosis present

## 2018-12-10 DIAGNOSIS — J45901 Unspecified asthma with (acute) exacerbation: Secondary | ICD-10-CM | POA: Diagnosis present

## 2018-12-10 LAB — COMPREHENSIVE METABOLIC PANEL
ALT: 19 U/L (ref 0–44)
AST: 24 U/L (ref 15–41)
Albumin: 3.8 g/dL (ref 3.5–5.0)
Alkaline Phosphatase: 79 U/L (ref 38–126)
Anion gap: 12 (ref 5–15)
BUN: 11 mg/dL (ref 8–23)
CO2: 20 mmol/L — ABNORMAL LOW (ref 22–32)
Calcium: 9 mg/dL (ref 8.9–10.3)
Chloride: 105 mmol/L (ref 98–111)
Creatinine, Ser: 1.16 mg/dL — ABNORMAL HIGH (ref 0.44–1.00)
GFR calc Af Amer: 58 mL/min — ABNORMAL LOW (ref >60)
GFR calc non Af Amer: 50 mL/min — ABNORMAL LOW (ref >60)
Glucose, Bld: 173 mg/dL — ABNORMAL HIGH (ref 70–99)
Potassium: 4.2 mmol/L (ref 3.5–5.1)
Sodium: 137 mmol/L (ref 135–145)
Total Bilirubin: 0.2 mg/dL — ABNORMAL LOW (ref 0.3–1.2)
Total Protein: 7.2 g/dL (ref 6.5–8.1)

## 2018-12-10 LAB — MRSA PCR SCREENING: MRSA by PCR: NEGATIVE

## 2018-12-10 LAB — CBC
HCT: 34.4 % — ABNORMAL LOW (ref 36.0–46.0)
Hemoglobin: 11.4 g/dL — ABNORMAL LOW (ref 12.0–15.0)
MCH: 30.9 pg (ref 26.0–34.0)
MCHC: 33.1 g/dL (ref 30.0–36.0)
MCV: 93.2 fL (ref 80.0–100.0)
Platelets: 310 10*3/uL (ref 150–400)
RBC: 3.69 MIL/uL — ABNORMAL LOW (ref 3.87–5.11)
RDW: 13.6 % (ref 11.5–15.5)
WBC: 12.4 10*3/uL — ABNORMAL HIGH (ref 4.0–10.5)
nRBC: 0 % (ref 0.0–0.2)

## 2018-12-10 LAB — HEMOGLOBIN A1C
Hgb A1c MFr Bld: 6.1 % — ABNORMAL HIGH (ref 4.8–5.6)
Mean Plasma Glucose: 128.37 mg/dL

## 2018-12-10 MED ORDER — PNEUMOCOCCAL VAC POLYVALENT 25 MCG/0.5ML IJ INJ
0.5000 mL | INJECTION | INTRAMUSCULAR | Status: AC
  Administered 2018-12-11: 0.5 mL via INTRAMUSCULAR
  Filled 2018-12-10: qty 0.5

## 2018-12-10 MED ORDER — IPRATROPIUM BROMIDE 0.02 % IN SOLN
0.5000 mg | Freq: Three times a day (TID) | RESPIRATORY_TRACT | Status: DC
  Administered 2018-12-11 – 2018-12-12 (×4): 0.5 mg via RESPIRATORY_TRACT
  Filled 2018-12-10 (×4): qty 2.5

## 2018-12-10 MED ORDER — CHLORHEXIDINE GLUCONATE CLOTH 2 % EX PADS
6.0000 | MEDICATED_PAD | Freq: Every day | CUTANEOUS | Status: DC
  Administered 2018-12-10 – 2018-12-11 (×2): 6 via TOPICAL

## 2018-12-10 MED ORDER — LORATADINE 10 MG PO TABS
10.0000 mg | ORAL_TABLET | Freq: Every day | ORAL | Status: DC
  Administered 2018-12-10 – 2018-12-11 (×2): 10 mg via ORAL
  Filled 2018-12-10 (×2): qty 1

## 2018-12-10 MED ORDER — LEVALBUTEROL HCL 1.25 MG/0.5ML IN NEBU
1.2500 mg | INHALATION_SOLUTION | Freq: Three times a day (TID) | RESPIRATORY_TRACT | Status: DC
  Administered 2018-12-11 – 2018-12-12 (×4): 1.25 mg via RESPIRATORY_TRACT
  Filled 2018-12-10 (×4): qty 0.5

## 2018-12-10 MED ORDER — TIZANIDINE HCL 4 MG PO TABS
ORAL_TABLET | ORAL | Status: AC
  Filled 2018-12-10: qty 1

## 2018-12-10 MED ORDER — LEVALBUTEROL HCL 1.25 MG/0.5ML IN NEBU
1.2500 mg | INHALATION_SOLUTION | Freq: Four times a day (QID) | RESPIRATORY_TRACT | Status: DC | PRN
  Administered 2018-12-11: 06:00:00 1.25 mg via RESPIRATORY_TRACT
  Filled 2018-12-10: qty 0.5

## 2018-12-10 MED ORDER — K PHOS MONO-SOD PHOS DI & MONO 155-852-130 MG PO TABS
500.0000 mg | ORAL_TABLET | Freq: Three times a day (TID) | ORAL | Status: AC
  Administered 2018-12-10 (×3): 500 mg via ORAL
  Filled 2018-12-10 (×3): qty 2

## 2018-12-10 MED ORDER — METHYLPREDNISOLONE SODIUM SUCC 40 MG IJ SOLR
40.0000 mg | Freq: Four times a day (QID) | INTRAMUSCULAR | Status: DC
  Administered 2018-12-10 – 2018-12-11 (×7): 40 mg via INTRAVENOUS
  Filled 2018-12-10 (×8): qty 1

## 2018-12-10 MED ORDER — GUAIFENESIN-DM 100-10 MG/5ML PO SYRP
5.0000 mL | ORAL_SOLUTION | ORAL | Status: DC | PRN
  Administered 2018-12-10 – 2018-12-11 (×6): 5 mL via ORAL
  Filled 2018-12-10 (×6): qty 5

## 2018-12-10 MED ORDER — PANTOPRAZOLE SODIUM 40 MG PO TBEC
40.0000 mg | DELAYED_RELEASE_TABLET | Freq: Every day | ORAL | Status: DC
  Administered 2018-12-10 – 2018-12-12 (×3): 40 mg via ORAL
  Filled 2018-12-10 (×3): qty 1

## 2018-12-10 MED ORDER — MONTELUKAST SODIUM 10 MG PO TABS
10.0000 mg | ORAL_TABLET | Freq: Every day | ORAL | Status: DC
  Administered 2018-12-10 – 2018-12-11 (×2): 10 mg via ORAL
  Filled 2018-12-10 (×2): qty 1

## 2018-12-10 NOTE — Progress Notes (Signed)
Patient is currently on room air with sats of 99%. All vitals are stable and patient is in no distress. BIPAP is not needed at this time. Will continue to monitor.

## 2018-12-10 NOTE — Progress Notes (Signed)
Report given to Maudie Mercury, RN on 300. Patient and vital signs stable.

## 2018-12-10 NOTE — Progress Notes (Signed)
PROGRESS NOTE    Cynthia Yang  ZOX:[id number] DOB: Sep 30, 1955 DOA: 12/09/2018 PCP: Catha NottinghamKeith, Amanda L, FNP     Brief Narrative:  As per H&P written by Dr. Robb Matarrtiz on 12/09/18 63 y.o. female with medical history significant of anxiety, asthma, fibromyalgia, GERD, hypertension who is coming to the emergency department due to progressively worse dyspnea associated with wheezing, dry cough with chest wall tenderness and mild sore throat since Thursday which has not responded to oral prednisone and bronchodilators at home.  She denies fever, chills, rhinorrhea, hemoptysis, nausea or emesis, dizziness, PND, orthopnea or pitting edema of the lower extremities.  Has some abdominal wall tenderness from coughing, but denies visceral abdominal pain, diarrhea, constipation, melena or hematochezia.  No dysuria, frequency or hematuria.  Denies polyuria, polydipsia, polyphagia or blurred vision.  ED Course: Her initial vital signs were Temperature 97.8 F, pulse 87, respirations 24, blood pressure 181/78 mmHg and O2 sat 97% on room air.  She received supplemental oxygen, bronchodilators, magnesium sulfate, Solu-Medrol and anxiolytics in the emergency department.  I added metoprolol 5 mg IVP x1 dose for tachycardia.  White count is 11.6, hemoglobin 12.1 g/dL and platelets [id number].  Sodium 134 mmol/L, phosphorus 1.8 and glucose 161 mg/dL.  The rest of the electrolytes and renal function were normal.  EKG was nonischemic.Her chest radiograph did not show any active disease.   Assessment & Plan: 1-asthma exacerbation with acute hypoxia -Slowly improving -Has no require the need of BiPAP and currently on room air -Still having difficulty speaking in full sentences, wheezing and very tachypneic with minimal exertion -Continue IV steroids, nebulizer management and the use of flutter valve -Patient is afebrile -Follow clinical response. -Will move to MedSurg bed. -Will add singular daily.  2-Hypertension  -Continue metoprolol twice a day -Blood pressure stable.  3-GERD (gastroesophageal reflux disease) -Continue PPI.  4-Anxiety/depression -Continue amitriptyline and clonazepam -No suicidal ideation or hallucinations currently. -Mood is stable.  5-Hyperglycemia -No prior history of diabetes reported -Will check A1c -Most likely associated with the use of his steroids -Continue to monitor CBG  6-chronic pain and neuropathy -Continue the use of Zanaflex and Neurontin  7-allergy rhinitis -Continue Flonase and Claritin   DVT prophylaxis: Lovenox Code Status: Full code Family Communication: No family at bedside. Disposition Plan: Transfer to MedSurg bed; increase physical activity, continue IV steroids and nebulizer management.  Start flutter valve.  Hopefully discharge in the next 24 to 48 hours.  Consultants:   None  Procedures:   See below for x-ray reports.  Antimicrobials:  Anti-infectives (From admission, onward)   None       Subjective: No fever, no chest pain, no nausea, no vomiting.  Patient still with difficulty speaking in full sentences and tachypneic with minimal exertion.  Good oxygen saturation and no requirement of BiPAP appreciated.  Objective: Vitals:   12/10/18 1500 12/10/18 1600 12/10/18 1624 12/10/18 1700  BP: 126/61 (!) 138/55  127/61  Pulse: 80 77 78 75  Resp: (!) 24 (!) 23 (!) 21 (!) 25  Temp:   98.3 F (36.8 C)   TempSrc:   Oral   SpO2: 92% 94% 95% 93%  Weight:      Height:        Intake/Output Summary (Last 24 hours) at 12/10/2018 1814 Last data filed at 12/10/2018 1718 Gross per 24 hour  Intake 885.09 ml  Output -  Net 885.09 ml   Filed Weights   12/09/18 1034  Weight: 78.9 kg    Examination: General  exam: Alert, awake, oriented x 3; still having difficulty speaking in full sentences and experiencing tachypnea with minimal exertion.  No has required the use of BiPAP and is currently on room air with good oxygen saturation.  Respiratory system: Positive scattered rhonchi; mild expiratory wheezing at the end of her expiratory phase.  No using accessory muscles. Cardiovascular system:RRR. No murmurs, rubs, gallops. Gastrointestinal system: Abdomen is nondistended, soft and nontender. No organomegaly or masses felt. Normal bowel sounds heard. Central nervous system: Alert and oriented. No focal neurological deficits. Extremities: No C/C/E, +pedal pulses Skin: No rashes, lesions or ulcers Psychiatry: Judgement and insight appear normal. Mood & affect appropriate.     Data Reviewed: I have personally reviewed following labs and imaging studies  CBC: Recent Labs  Lab 12/09/18 1256 12/10/18 0508  WBC 11.6* 12.4*  NEUTROABS 9.9*  --   HGB 12.1 11.4*  HCT 36.0 34.4*  MCV 93.0 93.2  PLT 274 310   Basic Metabolic Panel: Recent Labs  Lab 12/09/18 1256 12/09/18 1435 12/10/18 0508  NA 134*  --  137  K 4.4  --  4.2  CL 101  --  105  CO2 22  --  20*  GLUCOSE 161*  --  173*  BUN 11  --  11  CREATININE 0.98  --  1.16*  CALCIUM 9.1  --  9.0  PHOS  --  1.8*  --    GFR: Estimated Creatinine Clearance: 50.5 mL/min (A) (by C-G formula based on SCr of 1.16 mg/dL (H)).   Liver Function Tests: Recent Labs  Lab 12/10/18 0508  AST 24  ALT 19  ALKPHOS 79  BILITOT 0.2*  PROT 7.2  ALBUMIN 3.8     Recent Results (from the past 240 hour(s))  SARS Coronavirus 2 (CEPHEID - Performed in [facility] hospital lab), Hosp Order     Status: None   Collection Time: 12/09/18 11:52 AM   Specimen: Nasopharyngeal Swab  Result Value Ref Range Status   SARS Coronavirus 2 NEGATIVE NEGATIVE Final    Comment: (NOTE) If result is NEGATIVE SARS-CoV-2 target nucleic acids are NOT DETECTED. The SARS-CoV-2 RNA is generally detectable in upper and lower  respiratory specimens during the acute phase of infection. The lowest  concentration of SARS-CoV-2 viral copies this assay can detect is 250  copies / mL. A negative result  does not preclude SARS-CoV-2 infection  and should not be used as the sole basis for treatment or other  patient management decisions.  A negative result may occur with  improper specimen collection / handling, submission of specimen other  than nasopharyngeal swab, presence of viral mutation(s) within the  areas targeted by this assay, and inadequate number of viral copies  (<250 copies / mL). A negative result must be combined with clinical  observations, patient history, and epidemiological information. If result is POSITIVE SARS-CoV-2 target nucleic acids are DETECTED. The SARS-CoV-2 RNA is generally detectable in upper and lower  respiratory specimens dur ing the acute phase of infection.  Positive  results are indicative of active infection with SARS-CoV-2.  Clinical  correlation with patient history and other diagnostic information is  necessary to determine patient infection status.  Positive results do  not rule out bacterial infection or co-infection with other viruses. If result is PRESUMPTIVE POSTIVE SARS-CoV-2 nucleic acids MAY BE PRESENT.   A presumptive positive result was obtained on the submitted specimen  and confirmed on repeat testing.  While 2019 novel coronavirus  (SARS-CoV-2) nucleic acids  may be present in the submitted sample  additional confirmatory testing may be necessary for epidemiological  and / or clinical management purposes  to differentiate between  SARS-CoV-2 and other Sarbecovirus currently known to infect humans.  If clinically indicated additional testing with an alternate test  methodology [phone]([id number]) is advised. The SARS-CoV-2 RNA is generally  detectable in upper and lower respiratory sp ecimens during the acute  phase of infection. The expected result is Negative. Fact Sheet for Patients:  BoilerBrush.com.cyhttps://www.fda.gov/media/136312/download Fact Sheet for Healthcare Providers: https://pope.com/https://www.fda.gov/media/136313/download This test is not yet approved or  cleared by the [place] FDA and has been authorized for detection and/or diagnosis of SARS-CoV-2 by FDA under an Emergency Use Authorization (EUA).  This EUA will remain in effect (meaning this test can be used) for the duration of the COVID-19 declaration under Section 564(b)(1) of the Act, 21 U.S.C. section 360bbb-3(b)(1), unless the authorization is terminated or revoked sooner. Performed at [facility], [address] [place], [place] [address]   MRSA PCR Screening     Status: None   Collection Time: 12/10/18 12:49 AM   Specimen: Nasal Mucosa; Nasopharyngeal  Result Value Ref Range Status   MRSA by PCR NEGATIVE NEGATIVE Final    Comment:        The GeneXpert MRSA Assay (FDA approved for NASAL specimens only), is one component of a comprehensive MRSA colonization surveillance program. It is not intended to diagnose MRSA infection nor to guide or monitor treatment for MRSA infections. Performed at [facility], [address] [place], [place] [address]      Radiology Studies: Ct Angio Chest Pe W And/or Wo Contrast  Result Date: 12/09/2018 CLINICAL DATA:  Increased shortness of breath since Thursday, mid chest tightness today. EXAM: CT ANGIOGRAPHY CHEST WITH CONTRAST TECHNIQUE: Multidetector CT imaging of the chest was performed using the standard protocol during bolus administration of intravenous contrast. Multiplanar CT image reconstructions and MIPs were obtained to evaluate the vascular anatomy. CONTRAST:  100mL OMNIPAQUE IOHEXOL 350 MG/ML SOLN COMPARISON:  None. FINDINGS: Cardiovascular: Some of the most peripheral segmental and subsegmental pulmonary artery branches are difficult to definitively characterize due to mild patient breathing motion artifact, however, there is no pulmonary embolism identified within the main, lobar or central segmental pulmonary arteries bilaterally. No thoracic aortic aneurysm or evidence of aortic dissection. Heart size is within normal  limits. No pericardial effusion seen. Mediastinum/Nodes: No mass or enlarged lymph nodes appreciated within the mediastinum or perihilar regions. Esophagus is unremarkable. Trachea and central bronchi are unremarkable. Lungs/Pleura: Lungs are clear. No pleural effusion or pneumothorax. Upper Abdomen: Limited images of the upper abdomen are unremarkable. Musculoskeletal: No acute or suspicious osseous finding mild degenerative spurring at multiple levels of the slightly scoliotic thoracic spine. Stimulator device within the thoracic canal. No chest wall abnormality identified. Review of the MIP images confirms the above findings. IMPRESSION: No acute findings. No pulmonary embolism seen, with mild study limitations detailed above. No aortic aneurysm or evidence of aortic dissection. No pneumonia or pulmonary edema. Electronically Signed   By: Bary RichardStan  Maynard M.D.   On: 12/09/2018 18:29   Dg Chest Port 1 View  Result Date: 12/09/2018 CLINICAL DATA:  Shortness of breath EXAM: PORTABLE CHEST 1 VIEW COMPARISON:  11/19/2018 FINDINGS: Normal heart size and mediastinal contours. There is no edema, consolidation, effusion, or pneumothorax. Dorsal column stimulator leads. IMPRESSION: No evidence of active disease. Electronically Signed   By: Marnee SpringJonathon  Watts M.D.   On: 12/09/2018 12:01  Scheduled Meds: . amitriptyline  75 mg Oral QHS  . Chlorhexidine Gluconate Cloth  6 each Topical Q0600  . clonazePAM  1 mg Oral QHS  . enoxaparin (LOVENOX) injection  40 mg Subcutaneous Q24H  . fluticasone  1 spray Each Nare Daily  . gabapentin  400 mg Oral TID  . ipratropium  0.5 mg Nebulization Q6H  . levalbuterol  0.63 mg Nebulization Q6H  . loratadine  10 mg Oral QHS  . methylPREDNISolone (SOLU-MEDROL) injection  40 mg Intravenous Q6H  . metoprolol tartrate  50 mg Oral BID  . pantoprazole  40 mg Oral Daily  . phosphorus  500 mg Oral TID  . [START ON 12/11/2018] pneumococcal 23 valent vaccine  0.5 mL Intramuscular  Tomorrow-1000  . tiZANidine  4 mg Oral Q6H   Continuous Infusions:   LOS: 0 days    Time spent: 30 minutes.     Barton Dubois, MD [facility] Pager [phone]   12/10/2018, 6:14 PM

## 2018-12-10 NOTE — Progress Notes (Signed)
Patient is currently on [facility] with sats of 99%. Patient is in no distress. BIPAP is not needed at this time. Will continue to monitor.

## 2018-12-11 DIAGNOSIS — J9601 Acute respiratory failure with hypoxia: Secondary | ICD-10-CM

## 2018-12-11 MED ORDER — MAGNESIUM HYDROXIDE 400 MG/5ML PO SUSP
15.0000 mL | Freq: Every day | ORAL | Status: DC | PRN
  Administered 2018-12-11: 12:00:00 15 mL via ORAL
  Filled 2018-12-11: qty 30

## 2018-12-11 MED ORDER — HYDROCODONE-HOMATROPINE 5-1.5 MG/5ML PO SYRP
5.0000 mL | ORAL_SOLUTION | ORAL | Status: DC | PRN
  Administered 2018-12-11 – 2018-12-12 (×2): 5 mL via ORAL
  Filled 2018-12-11 (×2): qty 5

## 2018-12-11 MED ORDER — PREDNISONE 20 MG PO TABS
60.0000 mg | ORAL_TABLET | Freq: Every day | ORAL | Status: DC
  Administered 2018-12-12: 60 mg via ORAL
  Filled 2018-12-11: qty 3

## 2018-12-11 MED ORDER — BUDESONIDE 0.5 MG/2ML IN SUSP
0.5000 mg | Freq: Two times a day (BID) | RESPIRATORY_TRACT | Status: DC
  Administered 2018-12-11 – 2018-12-12 (×2): 0.5 mg via RESPIRATORY_TRACT
  Filled 2018-12-11 (×2): qty 2

## 2018-12-11 NOTE — Progress Notes (Signed)
After 5 minutes of being in bathroom and while standing was 95% on room air.  Still c/o SOB and coughing

## 2018-12-11 NOTE — Progress Notes (Signed)
PROGRESS NOTE  Cynthia Yang [id number] DOB: 11-13-1955 DOA: 12/09/2018 PCP: Catha NottinghamKeith, Amanda L, FNP  Brief History:  63 y.o.femalewith medical history significant ofanxiety, asthma, fibromyalgia, GERD, hypertension who is coming to the emergency department due to progressively worse dyspnea associated with wheezing, dry cough with chest wall tenderness and mild sore throat since Thursday which has not responded to oral prednisone and bronchodilators at home. She denies fever, chills, rhinorrhea, hemoptysis, nausea or emesis, dizziness, PND, orthopnea or pitting edema of the lower extremities. Has some abdominal wall tenderness from coughing, but denies diarrhea, hematochezia, melena, dysuria, hematuria.    ED Course:Her initial vital signs wereTemperature 97.8 F, pulse 87, respirations 24, blood pressure 181/78 mmHg and O2 sat 97% on room air. She received supplemental oxygen, bronchodilators, magnesium sulfate, Solu-Medrol and anxiolytics in the emergency department. I added metoprolol 5 mg IVP x1 dose for tachycardia.  White count is 11.6, hemoglobin 12.1 g/dL and platelets [id number]. Sodium 134 mmol/L, phosphorus 1.8 and glucose 161 mg/dL. The rest of the electrolytes and renal function were normal. EKG was nonischemic.Her chest radiograph did not show any active disease.  Assessment/Plan: Acute respiratory failure with hypoxia -due to asthma exacerbation -initially on BiPAP--weaned to RA  asthma exacerbation  -Slowly improving -Still having dyspnea with mild exertaion -Continue IV steroids, nebulizer management -Flutter valve -continue singulair -add pulmicort -add hycodan for cough  Hypertension -Continue metoprolol twice a day -add amlodipine  GERD (gastroesophageal reflux disease) -Continue PPI.  Anxiety/depression -Continue amitriptyline and clonazepam -No suicidal ideation or hallucinations currently.   Impaired glucose Tolerance -No prior  history of diabetes reported - A1c--6.1 -Continue to monitor CBG  chronic pain and neuropathy -Continue Zanaflex and Neurontin         Disposition Plan:   Home 7/16 if stable Family Communication:   No Family at bedside  Consultants:  none  Code Status:  FULL   DVT Prophylaxis: [place] Lovenox   Procedures: As Listed in Progress Note Above  Antibiotics: None       Subjective: Pt is breathing better.  She continues to have nonproductive cough and dyspnea on mild exertion .  Denies f/c cp, hemoptysis, n/v/d, abd pain  Objective: Vitals:   12/11/18 1233 12/11/18 1440 12/11/18 1454 12/11/18 1714  BP: (!) 153/70 (!) 160/71    Pulse: 84 73  84  Resp: (!) 24 17    Temp: 98.1 F (36.7 C) 98.7 F (37.1 C)    TempSrc: Oral Oral    SpO2: 95% 94% 95% 96%  Weight:      Height:       No intake or output data in the 24 hours ending 12/11/18 1740 Weight change: 1.274 kg Exam:   General:  Pt is alert, follows commands appropriately, not in acute distress  HEENT: No icterus, No thrush, No neck mass, [place]/AT  Cardiovascular: RRR, S1/S2, no rubs, no gallops  Respiratory: mild bibasilar wheeze.  Bilateral rales  Abdomen: Soft/+BS, non tender, non distended, no guarding  Extremities: No edema, No lymphangitis, No petechiae, No rashes, no synovitis   Data Reviewed: I have personally reviewed following labs and imaging studies Basic Metabolic Panel: Recent Labs  Lab 12/09/18 1256 12/09/18 1435 12/10/18 0508  NA 134*  --  137  K 4.4  --  4.2  CL 101  --  105  CO2 22  --  20*  GLUCOSE 161*  --  173*  BUN 11  --  11  CREATININE 0.98  --  1.16*  CALCIUM 9.1  --  9.0  PHOS  --  1.8*  --    Liver Function Tests: Recent Labs  Lab 12/10/18 0508  AST 24  ALT 19  ALKPHOS 79  BILITOT 0.2*  PROT 7.2  ALBUMIN 3.8   No results for input(s): LIPASE, AMYLASE in the last 168 hours. No results for input(s): AMMONIA in the last 168 hours. Coagulation Profile: No  results for input(s): INR, PROTIME in the last 168 hours. CBC: Recent Labs  Lab 12/09/18 1256 12/10/18 0508  WBC 11.6* 12.4*  NEUTROABS 9.9*  --   HGB 12.1 11.4*  HCT 36.0 34.4*  MCV 93.0 93.2  PLT 274 310   Cardiac Enzymes: No results for input(s): CKTOTAL, CKMB, CKMBINDEX, TROPONINI in the last 168 hours. BNP: Invalid input(s): POCBNP CBG: No results for input(s): GLUCAP in the last 168 hours. HbA1C: Recent Labs    12/10/18 0508  HGBA1C 6.1*   Urine analysis: No results found for: COLORURINE, APPEARANCEUR, LABSPEC, PHURINE, GLUCOSEU, HGBUR, BILIRUBINUR, KETONESUR, PROTEINUR, UROBILINOGEN, NITRITE, LEUKOCYTESUR Sepsis Labs: @LABRCNTIP (procalcitonin:4,lacticidven:4) ) Recent Results (from the past 240 hour(s))  SARS Coronavirus 2 (CEPHEID - Performed in [facility] lab), Hosp Order     Status: None   Collection Time: 12/09/18 11:52 AM   Specimen: Nasopharyngeal Swab  Result Value Ref Range Status   SARS Coronavirus 2 NEGATIVE NEGATIVE Final    Comment: (NOTE) If result is NEGATIVE SARS-CoV-2 target nucleic acids are NOT DETECTED. The SARS-CoV-2 RNA is generally detectable in upper and lower  respiratory specimens during the acute phase of infection. The lowest  concentration of SARS-CoV-2 viral copies this assay can detect is 250  copies / mL. A negative result does not preclude SARS-CoV-2 infection  and should not be used as the sole basis for treatment or other  patient management decisions.  A negative result may occur with  improper specimen collection / handling, submission of specimen other  than nasopharyngeal swab, presence of viral mutation(s) within the  areas targeted by this assay, and inadequate number of viral copies  (<250 copies / mL). A negative result must be combined with clinical  observations, patient history, and epidemiological information. If result is POSITIVE SARS-CoV-2 target nucleic acids are DETECTED. The SARS-CoV-2 RNA is  generally detectable in upper and lower  respiratory specimens dur ing the acute phase of infection.  Positive  results are indicative of active infection with SARS-CoV-2.  Clinical  correlation with patient history and other diagnostic information is  necessary to determine patient infection status.  Positive results do  not rule out bacterial infection or Yang-infection with other viruses. If result is PRESUMPTIVE POSTIVE SARS-CoV-2 nucleic acids MAY BE PRESENT.   A presumptive positive result was obtained on the submitted specimen  and confirmed on repeat testing.  While 2019 novel coronavirus  (SARS-CoV-2) nucleic acids may be present in the submitted sample  additional confirmatory testing may be necessary for epidemiological  and / or clinical management purposes  to differentiate between  SARS-CoV-2 and other Sarbecovirus currently known to infect humans.  If clinically indicated additional testing with an alternate test  methodology [phone]([id number]) is advised. The SARS-CoV-2 RNA is generally  detectable in upper and lower respiratory sp ecimens during the acute  phase of infection. The expected result is Negative. Fact Sheet for Patients:  BoilerBrush.com.cyhttps://www.fda.gov/media/136312/download Fact Sheet for Healthcare Providers: https://pope.com/https://www.fda.gov/media/136313/download This test is not yet approved or cleared by the [place] FDA and has  been authorized for detection and/or diagnosis of SARS-CoV-2 by FDA under an Emergency Use Authorization (EUA).  This EUA will remain in effect (meaning this test can be used) for the duration of the COVID-19 declaration under Section 564(b)(1) of the Act, 21 U.S.C. section 360bbb-3(b)(1), unless the authorization is terminated or revoked sooner. Performed at [facility], [address] [place], [place] [address]   MRSA PCR Screening     Status: None   Collection Time: 12/10/18 12:49 AM   Specimen: Nasal Mucosa; Nasopharyngeal  Result Value Ref Range  Status   MRSA by PCR NEGATIVE NEGATIVE Final    Comment:        The GeneXpert MRSA Assay (FDA approved for NASAL specimens only), is one component of a comprehensive MRSA colonization surveillance program. It is not intended to diagnose MRSA infection nor to guide or monitor treatment for MRSA infections. Performed at [facility], [address] [place], [place] [address]      Scheduled Meds:  amitriptyline  75 mg Oral QHS   Chlorhexidine Gluconate Cloth  6 each Topical Q0600   clonazePAM  1 mg Oral QHS   enoxaparin (LOVENOX) injection  40 mg Subcutaneous Q24H   fluticasone  1 spray Each Nare Daily   gabapentin  400 mg Oral TID   ipratropium  0.5 mg Nebulization TID   levalbuterol  1.25 mg Nebulization TID   loratadine  10 mg Oral QHS   methylPREDNISolone (SOLU-MEDROL) injection  40 mg Intravenous Q6H   metoprolol tartrate  50 mg Oral BID   montelukast  10 mg Oral QHS   pantoprazole  40 mg Oral Daily   tiZANidine  4 mg Oral Q6H   Continuous Infusions:  Procedures/Studies: Ct Angio Chest Pe W And/or Wo Contrast  Result Date: 12/09/2018 CLINICAL DATA:  Increased shortness of breath since Thursday, mid chest tightness today. EXAM: CT ANGIOGRAPHY CHEST WITH CONTRAST TECHNIQUE: Multidetector CT imaging of the chest was performed using the standard protocol during bolus administration of intravenous contrast. Multiplanar CT image reconstructions and MIPs were obtained to evaluate the vascular anatomy. CONTRAST:  100mL OMNIPAQUE IOHEXOL 350 MG/ML SOLN COMPARISON:  None. FINDINGS: Cardiovascular: Some of the most peripheral segmental and subsegmental pulmonary artery branches are difficult to definitively characterize due to mild patient breathing motion artifact, however, there is no pulmonary embolism identified within the main, lobar or central segmental pulmonary arteries bilaterally. No thoracic aortic aneurysm or evidence of aortic dissection. Heart size is within  normal limits. No pericardial effusion seen. Mediastinum/Nodes: No mass or enlarged lymph nodes appreciated within the mediastinum or perihilar regions. Esophagus is unremarkable. Trachea and central bronchi are unremarkable. Lungs/Pleura: Lungs are clear. No pleural effusion or pneumothorax. Upper Abdomen: Limited images of the upper abdomen are unremarkable. Musculoskeletal: No acute or suspicious osseous finding mild degenerative spurring at multiple levels of the slightly scoliotic thoracic spine. Stimulator device within the thoracic canal. No chest wall abnormality identified. Review of the MIP images confirms the above findings. IMPRESSION: No acute findings. No pulmonary embolism seen, with mild study limitations detailed above. No aortic aneurysm or evidence of aortic dissection. No pneumonia or pulmonary edema. Electronically Signed   By: Bary RichardStan  Maynard M.D.   On: 12/09/2018 18:29   Dg Chest Port 1 View  Result Date: 12/09/2018 CLINICAL DATA:  Shortness of breath EXAM: PORTABLE CHEST 1 VIEW COMPARISON:  11/19/2018 FINDINGS: Normal heart size and mediastinal contours. There is no edema, consolidation, effusion, or pneumothorax. Dorsal column stimulator leads. IMPRESSION: No evidence  of active disease. Electronically Signed   By: Monte Fantasia M.D.   On: 12/09/2018 12:01    Orson Eva, DO  [facility] Pager [phone]  If 7PM-7AM, please contact night-coverage [email or web address] Password [email or web address] 12/11/2018, 5:40 PM   LOS: 1 day

## 2018-12-12 LAB — BASIC METABOLIC PANEL
Anion gap: 10 (ref 5–15)
BUN: 24 mg/dL — ABNORMAL HIGH (ref 8–23)
CO2: 27 mmol/L (ref 22–32)
Calcium: 8.9 mg/dL (ref 8.9–10.3)
Chloride: 100 mmol/L (ref 98–111)
Creatinine, Ser: 1.06 mg/dL — ABNORMAL HIGH (ref 0.44–1.00)
GFR calc Af Amer: >60 mL/min (ref >60)
GFR calc non Af Amer: 56 mL/min — ABNORMAL LOW (ref >60)
Glucose, Bld: 100 mg/dL — ABNORMAL HIGH (ref 70–99)
Potassium: 4.3 mmol/L (ref 3.5–5.1)
Sodium: 137 mmol/L (ref 135–145)

## 2018-12-12 LAB — CBC
HCT: 34.3 % — ABNORMAL LOW (ref 36.0–46.0)
Hemoglobin: 11.2 g/dL — ABNORMAL LOW (ref 12.0–15.0)
MCH: 31.1 pg (ref 26.0–34.0)
MCHC: 32.7 g/dL (ref 30.0–36.0)
MCV: 95.3 fL (ref 80.0–100.0)
Platelets: 270 10*3/uL (ref 150–400)
RBC: 3.6 MIL/uL — ABNORMAL LOW (ref 3.87–5.11)
RDW: 13.7 % (ref 11.5–15.5)
WBC: 11.2 10*3/uL — ABNORMAL HIGH (ref 4.0–10.5)
nRBC: 0 % (ref 0.0–0.2)

## 2018-12-12 LAB — MAGNESIUM: Magnesium: 2.5 mg/dL — ABNORMAL HIGH (ref 1.7–2.4)

## 2018-12-12 MED ORDER — AMLODIPINE BESYLATE 5 MG PO TABS: 5.0000 mg | ORAL_TABLET | Freq: Every day | ORAL | 1 refills | Status: DC

## 2018-12-12 MED ORDER — HYDROCODONE-HOMATROPINE 5-1.5 MG/5ML PO SYRP: 5.0000 mL | ORAL_SOLUTION | ORAL | 0 refills | Status: AC | PRN

## 2018-12-12 MED ORDER — MONTELUKAST SODIUM 10 MG PO TABS: 10.0000 mg | ORAL_TABLET | Freq: Every day | ORAL | 2 refills | Status: AC

## 2018-12-12 MED ORDER — PREDNISONE 10 MG PO TABS: 60.0000 mg | ORAL_TABLET | Freq: Every day | ORAL | 0 refills | Status: DC

## 2018-12-12 NOTE — Progress Notes (Signed)
Patient c/o feeling like her heart was racing. Vital signs stable. EKG obtained and patient placed on telemetry. EKG show Normal Sinus Rhythm and telemetry showing Normal Sinus Rhythm.

## 2018-12-12 NOTE — Plan of Care (Signed)

## 2018-12-12 NOTE — TOC Initial Note (Addendum)
Transition of Care [facility]) - Initial/Assessment Note    Patient Details  Name: Cynthia Yang MRN: [id number] Date of Birth: May 23, 1956  Transition of Care [facility]) CM/SW Contact:    Ihor Gully, LCSW Phone Number: 12/12/2018, 12:25 PM  Clinical Narrative:                 Patient  Reports being independent at baseline and all needs met. She lives with her spouse. She has a walker in the home but does not use it. Patient's follow up appointment is scheduled for 12/16/2018 @ 4:40p.m. in office and added to patient's AVS.  Discharge summary faxed to PCP office.   Expected Discharge Plan: Home/Self Care Barriers to Discharge: No Barriers Identified   Patient Goals and CMS Choice Patient states their goals for this hospitalization and ongoing recovery are:: Return to independence.      Expected Discharge Plan and Services Expected Discharge Plan: Home/Self Care         Expected Discharge Date: 12/12/18                                    Prior Living Arrangements/Services     Patient language and need for interpreter reviewed:: Yes        Need for Family Participation in Patient Care: Yes (Comment) Care giver support system in place?: Yes (comment)   Criminal Activity/Legal Involvement Pertinent to Current Situation/Hospitalization: No - Comment as needed  Activities of Daily Living Home Assistive Devices/Equipment: None ADL Screening (condition at time of admission) Patient's cognitive ability adequate to safely complete daily activities?: Yes Is the patient deaf or have difficulty hearing?: No Does the patient have difficulty seeing, even when wearing glasses/contacts?: No Does the patient have difficulty concentrating, remembering, or making decisions?: No Patient able to express need for assistance with ADLs?: Yes Does the patient have difficulty dressing or bathing?: No Independently performs ADLs?: Yes (appropriate for developmental age) Does the patient  have difficulty walking or climbing stairs?: No Weakness of Legs: None Weakness of Arms/Hands: None  Permission Sought/Granted                  Emotional Assessment Appearance:: Appears stated age   Affect (typically observed): Calm, Accepting Orientation: : Oriented to Self, Oriented to Place, Oriented to  Time, Oriented to Situation   Psych Involvement: No (comment)  Admission diagnosis:  SOB (shortness of breath) [R06.02] Dyspnea, unspecified type [R06.00] Patient Active Problem List   Diagnosis Date Noted  . Acute respiratory failure with hypoxia ([place]) 12/11/2018  . Asthma exacerbation 12/09/2018  . Hypertension   . GERD (gastroesophageal reflux disease)   . Anxiety   . Hyperglycemia   . Intractable vomiting with nausea   . Hypertensive urgency   . AKI (acute kidney injury) ([place])   . Hypokalemia   . Opioid use with withdrawal ([place])   . Hypertensive emergency 01/19/2017  . Acute nonintractable headache   . Opioid withdrawal ([place])    PCP:  Dorothyann Peng, FNP Pharmacy:   [facility], [place] [address] [place] [address] Phone: [phone] Fax: [phone]  [facility] [phone] - [place], [place] [address] [place] [address] Phone: [phone] Fax: [phone]     Social Determinants of Health (SDOH) Interventions    Readmission Risk  Interventions No flowsheet data found.

## 2018-12-12 NOTE — Discharge Summary (Signed)
Physician Discharge Summary  Cynthia Sacramentoatricia K Speers ZOX:[id number] DOB: Oct 27, 1955 DOA: 12/09/2018  PCP: Catha NottinghamKeith, Amanda L, FNP  Admit date: 12/09/2018 Discharge date: 12/12/2018  Admitted From: Home Disposition:  Home   Recommendations for Outpatient Follow-up:  1. Follow up with PCP in 1-2 weeks 2. Please obtain BMP/CBC in one week     Discharge Condition: Stable CODE STATUS: FULL Diet recommendation: Heart Healthy / Carb Modified   Brief/Interim Summary: 63 y.o.femalewith medical history significant ofanxiety, asthma, fibromyalgia, GERD, hypertension who is coming to the emergency department due to progressively worse dyspnea associated with wheezing, dry cough with chest wall tenderness and mild sore throat since Thursday which has not responded to oral prednisone and bronchodilators at home. She denies fever, chills, rhinorrhea, hemoptysis, nausea or emesis, dizziness, PND, orthopnea or pitting edema of the lower extremities. Has some abdominal wall tenderness from coughing, but denies diarrhea, hematochezia, melena, dysuria, hematuria.    ED Course:Her initial vital signs wereTemperature 97.8 F, pulse 87, respirations 24, blood pressure 181/78 mmHg and O2 sat 97% on room air. She received supplemental oxygen, bronchodilators, magnesium sulfate, Solu-Medrol and anxiolytics in the emergency department. I added metoprolol 5 mg IVP x1 dose for tachycardia.  White count is 11.6, hemoglobin 12.1 g/dL and platelets [id number]. Sodium 134 mmol/L, phosphorus 1.8 and glucose 161 mg/dL. The rest of the electrolytes and renal function were normal. EKG was nonischemic.Her chest radiograph did not show any active disease.  Discharge Diagnoses:  Acute respiratory failure with hypoxia -due to asthma exacerbation -initially on BiPAP--weaned to RA  asthma exacerbation -Slowly improving -Still having dyspnea with mild exertaion -Continue IV steroids, nebulizer management -d/c home with  prednisone taper -Flutter valve -continue singulair -add pulmicort during hospitalization -add hycodan for cough -ambulatory pulse ox on day of d/c did not show oxygen desaturation <88%  Hypertension -Continue metoprolol twice a day -add amlodipine  GERD (gastroesophageal reflux disease) -Continue PPI.  Anxiety/depression -Continue amitriptyline and clonazepam -No suicidal ideation or hallucinations currently.   Impaired glucose Tolerance -No prior history of diabetes reported - A1c--6.1 -Continue to monitor CBG  chronic pain and neuropathy -Continue Zanaflex and Neurontin       Discharge Instructions   Allergies as of 12/12/2018      Reactions   Morphine And Related Other (See Comments)   "Shakes real bad"   Trazodone And Nefazodone    Fast heart rate   Vistaril [hydroxyzine Hcl] Other (See Comments)   Stops breathing      Medication List    TAKE these medications   albuterol 108 (90 Base) MCG/ACT inhaler Commonly known as: VENTOLIN HFA Inhale 2 puffs into the lungs every 4 (four) hours as needed for wheezing or shortness of breath.   albuterol (2.5 MG/3ML) 0.083% nebulizer solution Commonly known as: PROVENTIL Take 2.5 mg by nebulization every 6 (six) hours as needed for wheezing or shortness of breath.   amitriptyline 75 MG tablet Commonly known as: ELAVIL Take 75 mg by mouth at bedtime.   amLODipine 5 MG tablet Commonly known as: NORVASC Take 1 tablet (5 mg total) by mouth daily.   clonazePAM 0.5 MG tablet Commonly known as: KLONOPIN Take 1 tablet (0.5 mg total) by mouth daily. What changed:   how much to take  when to take this   fluticasone 50 MCG/ACT nasal spray Commonly known as: FLONASE Place 1 spray into both nostrils daily.   gabapentin 400 MG capsule Commonly known as: NEURONTIN Take 400 mg by mouth 3 (three) times daily.  HYDROcodone-homatropine 5-1.5 MG/5ML syrup Commonly known as: HYCODAN Take 5 mLs by mouth  every 4 (four) hours as needed for cough.   levocetirizine 5 MG tablet Commonly known as: XYZAL Take 1 tablet by mouth at bedtime.   metoprolol tartrate 50 MG tablet Commonly known as: LOPRESSOR Take 1 tablet (50 mg total) by mouth 2 (two) times daily. What changed: how much to take   montelukast 10 MG tablet Commonly known as: SINGULAIR Take 1 tablet (10 mg total) by mouth at bedtime.   predniSONE 10 MG tablet Commonly known as: DELTASONE Take 6 tablets (60 mg total) by mouth daily with breakfast. And decrease by one tablet daily Start taking on: December 13, 2018 What changed:   medication strength  how much to take  when to take this  additional instructions   Symbicort 160-4.5 MCG/ACT inhaler Generic drug: budesonide-formoterol Inhale 2 puffs into the lungs 2 (two) times daily as needed for wheezing.   Zanaflex 4 MG capsule Generic drug: tiZANidine Take 1 tablet by mouth every 6 (six) hours.       Allergies  Allergen Reactions   Morphine And Related Other (See Comments)    "Shakes real bad"   Trazodone And Nefazodone     Fast heart rate   Vistaril [Hydroxyzine Hcl] Other (See Comments)    Stops breathing    Consultations:  none   Procedures/Studies: Ct Angio Chest Pe W And/or Wo Contrast  Result Date: 12/09/2018 CLINICAL DATA:  Increased shortness of breath since Thursday, mid chest tightness today. EXAM: CT ANGIOGRAPHY CHEST WITH CONTRAST TECHNIQUE: Multidetector CT imaging of the chest was performed using the standard protocol during bolus administration of intravenous contrast. Multiplanar CT image reconstructions and MIPs were obtained to evaluate the vascular anatomy. CONTRAST:  177mL OMNIPAQUE IOHEXOL 350 MG/ML SOLN COMPARISON:  None. FINDINGS: Cardiovascular: Some of the most peripheral segmental and subsegmental pulmonary artery branches are difficult to definitively characterize due to mild patient breathing motion artifact, however, there is no  pulmonary embolism identified within the main, lobar or central segmental pulmonary arteries bilaterally. No thoracic aortic aneurysm or evidence of aortic dissection. Heart size is within normal limits. No pericardial effusion seen. Mediastinum/Nodes: No mass or enlarged lymph nodes appreciated within the mediastinum or perihilar regions. Esophagus is unremarkable. Trachea and central bronchi are unremarkable. Lungs/Pleura: Lungs are clear. No pleural effusion or pneumothorax. Upper Abdomen: Limited images of the upper abdomen are unremarkable. Musculoskeletal: No acute or suspicious osseous finding mild degenerative spurring at multiple levels of the slightly scoliotic thoracic spine. Stimulator device within the thoracic canal. No chest wall abnormality identified. Review of the MIP images confirms the above findings. IMPRESSION: No acute findings. No pulmonary embolism seen, with mild study limitations detailed above. No aortic aneurysm or evidence of aortic dissection. No pneumonia or pulmonary edema. Electronically Signed   By: Franki Cabot M.D.   On: 12/09/2018 18:29   Dg Chest Port 1 View  Result Date: 12/09/2018 CLINICAL DATA:  Shortness of breath EXAM: PORTABLE CHEST 1 VIEW COMPARISON:  11/19/2018 FINDINGS: Normal heart size and mediastinal contours. There is no edema, consolidation, effusion, or pneumothorax. Dorsal column stimulator leads. IMPRESSION: No evidence of active disease. Electronically Signed   By: Monte Fantasia M.D.   On: 12/09/2018 12:01        Discharge Exam: Vitals:   12/12/18 0855 12/12/18 0901  BP:    Pulse:    Resp:    Temp:    SpO2: 97% 100%   Vitals:  12/11/18 2040 12/12/18 0533 12/12/18 0855 12/12/18 0901  BP: (!) 147/70 (!) 177/88    Pulse: (!) 103 66    Resp: (!) 27 18    Temp: 98.5 F (36.9 C) 97.6 F (36.4 C)    TempSrc: Oral Oral    SpO2: 94% 97% 97% 100%  Weight:      Height:        General: Pt is alert, awake, not in acute  distress Cardiovascular: RRR, S1/S2 +, no rubs, no gallops Respiratory: bibasilar rales. No wheeze Abdominal: Soft, NT, ND, bowel sounds + Extremities: no edema, no cyanosis   The results of significant diagnostics from this hospitalization (including imaging, microbiology, ancillary and laboratory) are listed below for reference.    Significant Diagnostic Studies: Ct Angio Chest Pe W And/or Wo Contrast  Result Date: 12/09/2018 CLINICAL DATA:  Increased shortness of breath since Thursday, mid chest tightness today. EXAM: CT ANGIOGRAPHY CHEST WITH CONTRAST TECHNIQUE: Multidetector CT imaging of the chest was performed using the standard protocol during bolus administration of intravenous contrast. Multiplanar CT image reconstructions and MIPs were obtained to evaluate the vascular anatomy. CONTRAST:  100mL OMNIPAQUE IOHEXOL 350 MG/ML SOLN COMPARISON:  None. FINDINGS: Cardiovascular: Some of the most peripheral segmental and subsegmental pulmonary artery branches are difficult to definitively characterize due to mild patient breathing motion artifact, however, there is no pulmonary embolism identified within the main, lobar or central segmental pulmonary arteries bilaterally. No thoracic aortic aneurysm or evidence of aortic dissection. Heart size is within normal limits. No pericardial effusion seen. Mediastinum/Nodes: No mass or enlarged lymph nodes appreciated within the mediastinum or perihilar regions. Esophagus is unremarkable. Trachea and central bronchi are unremarkable. Lungs/Pleura: Lungs are clear. No pleural effusion or pneumothorax. Upper Abdomen: Limited images of the upper abdomen are unremarkable. Musculoskeletal: No acute or suspicious osseous finding mild degenerative spurring at multiple levels of the slightly scoliotic thoracic spine. Stimulator device within the thoracic canal. No chest wall abnormality identified. Review of the MIP images confirms the above findings. IMPRESSION: No  acute findings. No pulmonary embolism seen, with mild study limitations detailed above. No aortic aneurysm or evidence of aortic dissection. No pneumonia or pulmonary edema. Electronically Signed   By: Bary RichardStan  Maynard M.D.   On: 12/09/2018 18:29   Dg Chest Port 1 View  Result Date: 12/09/2018 CLINICAL DATA:  Shortness of breath EXAM: PORTABLE CHEST 1 VIEW COMPARISON:  11/19/2018 FINDINGS: Normal heart size and mediastinal contours. There is no edema, consolidation, effusion, or pneumothorax. Dorsal column stimulator leads. IMPRESSION: No evidence of active disease. Electronically Signed   By: Marnee SpringJonathon  Watts M.D.   On: 12/09/2018 12:01     Microbiology: Recent Results (from the past 240 hour(s))  SARS Coronavirus 2 (CEPHEID - Performed in [facility] lab), Hosp Order     Status: None   Collection Time: 12/09/18 11:52 AM   Specimen: Nasopharyngeal Swab  Result Value Ref Range Status   SARS Coronavirus 2 NEGATIVE NEGATIVE Final    Comment: (NOTE) If result is NEGATIVE SARS-CoV-2 target nucleic acids are NOT DETECTED. The SARS-CoV-2 RNA is generally detectable in upper and lower  respiratory specimens during the acute phase of infection. The lowest  concentration of SARS-CoV-2 viral copies this assay can detect is 250  copies / mL. A negative result does not preclude SARS-CoV-2 infection  and should not be used as the sole basis for treatment or other  patient management decisions.  A negative result may occur with  improper specimen  collection / handling, submission of specimen other  than nasopharyngeal swab, presence of viral mutation(s) within the  areas targeted by this assay, and inadequate number of viral copies  (<250 copies / mL). A negative result must be combined with clinical  observations, patient history, and epidemiological information. If result is POSITIVE SARS-CoV-2 target nucleic acids are DETECTED. The SARS-CoV-2 RNA is generally detectable in upper and lower   respiratory specimens dur ing the acute phase of infection.  Positive  results are indicative of active infection with SARS-CoV-2.  Clinical  correlation with patient history and other diagnostic information is  necessary to determine patient infection status.  Positive results do  not rule out bacterial infection or co-infection with other viruses. If result is PRESUMPTIVE POSTIVE SARS-CoV-2 nucleic acids MAY BE PRESENT.   A presumptive positive result was obtained on the submitted specimen  and confirmed on repeat testing.  While 2019 novel coronavirus  (SARS-CoV-2) nucleic acids may be present in the submitted sample  additional confirmatory testing may be necessary for epidemiological  and / or clinical management purposes  to differentiate between  SARS-CoV-2 and other Sarbecovirus currently known to infect humans.  If clinically indicated additional testing with an alternate test  methodology [phone]([id number]) is advised. The SARS-CoV-2 RNA is generally  detectable in upper and lower respiratory sp ecimens during the acute  phase of infection. The expected result is Negative. Fact Sheet for Patients:  BoilerBrush.com.cyhttps://www.fda.gov/media/136312/download Fact Sheet for Healthcare Providers: https://pope.com/https://www.fda.gov/media/136313/download This test is not yet approved or cleared by the [place] FDA and has been authorized for detection and/or diagnosis of SARS-CoV-2 by FDA under an Emergency Use Authorization (EUA).  This EUA will remain in effect (meaning this test can be used) for the duration of the COVID-19 declaration under Section 564(b)(1) of the Act, 21 U.S.C. section 360bbb-3(b)(1), unless the authorization is terminated or revoked sooner. Performed at [facility], [address] [place], [place] [address]   MRSA PCR Screening     Status: None   Collection Time: 12/10/18 12:49 AM   Specimen: Nasal Mucosa; Nasopharyngeal  Result Value Ref Range Status   MRSA by PCR NEGATIVE NEGATIVE  Final    Comment:        The GeneXpert MRSA Assay (FDA approved for NASAL specimens only), is one component of a comprehensive MRSA colonization surveillance program. It is not intended to diagnose MRSA infection nor to guide or monitor treatment for MRSA infections. Performed at [facility], [address] [place], [place] [address]      Labs: Basic Metabolic Panel: Recent Labs  Lab 12/09/18 1256 12/09/18 1435 12/10/18 0508 12/12/18 0550  NA 134*  --  137 137  K 4.4  --  4.2 4.3  CL 101  --  105 100  CO2 22  --  20* 27  GLUCOSE 161*  --  173* 100*  BUN 11  --  11 24*  CREATININE 0.98  --  1.16* 1.06*  CALCIUM 9.1  --  9.0 8.9  MG  --   --   --  2.5*  PHOS  --  1.8*  --   --    Liver Function Tests: Recent Labs  Lab 12/10/18 0508  AST 24  ALT 19  ALKPHOS 79  BILITOT 0.2*  PROT 7.2  ALBUMIN 3.8   No results for input(s): LIPASE, AMYLASE in the last 168 hours. No results for input(s): AMMONIA in the last 168 hours. CBC: Recent Labs  Lab 12/09/18 1256 12/10/18  4098 12/12/18 0550  WBC 11.6* 12.4* 11.2*  NEUTROABS 9.9*  --   --   HGB 12.1 11.4* 11.2*  HCT 36.0 34.4* 34.3*  MCV 93.0 93.2 95.3  PLT 274 310 270   Cardiac Enzymes: No results for input(s): CKTOTAL, CKMB, CKMBINDEX, TROPONINI in the last 168 hours. BNP: Invalid input(s): POCBNP CBG: No results for input(s): GLUCAP in the last 168 hours.  Time coordinating discharge:  36 minutes  Signed:  Catarina Hartshorn, DO [facility] Pager: [phone] 12/12/2018, 12:27 PM

## 2018-12-12 NOTE — Progress Notes (Signed)
Nsg Discharge Note  Admit Date:  12/09/2018 Discharge date: 12/12/2018   Cynthia SacramentoPatricia K Yang to be D/C'd home per MD order.  AVS completed.  Copy for chart, and copy for patient signed, and dated. Patient/caregiver able to verbalize understanding.  Discharge Medication: Allergies as of 12/12/2018      Reactions   Morphine And Related Other (See Comments)   "Shakes real bad"   Trazodone And Nefazodone    Fast heart rate   Vistaril [hydroxyzine Hcl] Other (See Comments)   Stops breathing      Medication List    TAKE these medications   albuterol 108 (90 Base) MCG/ACT inhaler Commonly known as: VENTOLIN HFA Inhale 2 puffs into the lungs every 4 (four) hours as needed for wheezing or shortness of breath.   albuterol (2.5 MG/3ML) 0.083% nebulizer solution Commonly known as: PROVENTIL Take 2.5 mg by nebulization every 6 (six) hours as needed for wheezing or shortness of breath.   amitriptyline 75 MG tablet Commonly known as: ELAVIL Take 75 mg by mouth at bedtime.   amLODipine 5 MG tablet Commonly known as: NORVASC Take 1 tablet (5 mg total) by mouth daily.   clonazePAM 0.5 MG tablet Commonly known as: KLONOPIN Take 1 tablet (0.5 mg total) by mouth daily. What changed:   how much to take  when to take this   fluticasone 50 MCG/ACT nasal spray Commonly known as: FLONASE Place 1 spray into both nostrils daily.   gabapentin 400 MG capsule Commonly known as: NEURONTIN Take 400 mg by mouth 3 (three) times daily.   HYDROcodone-homatropine 5-1.5 MG/5ML syrup Commonly known as: HYCODAN Take 5 mLs by mouth every 4 (four) hours as needed for cough.   levocetirizine 5 MG tablet Commonly known as: XYZAL Take 1 tablet by mouth at bedtime.   metoprolol tartrate 50 MG tablet Commonly known as: LOPRESSOR Take 1 tablet (50 mg total) by mouth 2 (two) times daily. What changed: how much to take   montelukast 10 MG tablet Commonly known as: SINGULAIR Take 1 tablet (10 mg total)  by mouth at bedtime.   predniSONE 10 MG tablet Commonly known as: DELTASONE Take 6 tablets (60 mg total) by mouth daily with breakfast. And decrease by one tablet daily Start taking on: December 13, 2018 What changed:   medication strength  how much to take  when to take this  additional instructions   Symbicort 160-4.5 MCG/ACT inhaler Generic drug: budesonide-formoterol Inhale 2 puffs into the lungs 2 (two) times daily as needed for wheezing.   Zanaflex 4 MG capsule Generic drug: tiZANidine Take 1 tablet by mouth every 6 (six) hours.       Discharge Assessment: Vitals:   12/12/18 0855 12/12/18 0901  BP:    Pulse:    Resp:    Temp:    SpO2: 97% 100%   Skin clean, dry and intact without evidence of skin break down, no evidence of skin tears noted. IV catheter discontinued intact. Site without signs and symptoms of complications - no redness or edema noted at insertion site, patient denies c/o pain - only slight tenderness at site.  Dressing with slight pressure applied.  D/c Instructions-Education: Discharge instructions given to patient/family with verbalized understanding. D/c education completed with patient/family including follow up instructions, medication list, d/c activities limitations if indicated, with other d/c instructions as indicated by MD - patient able to verbalize understanding, all questions fully answered. Patient instructed to return to ED, call 911, or call MD for any  changes in condition.  Patient escorted via [place], and D/C home via private auto.  Venita Sheffield, RN 12/12/2018 1:46 PM

## 2020-04-03 ENCOUNTER — Inpatient Hospital Stay (HOSPITAL_COMMUNITY)
Admission: EM | Admit: 2020-04-03 | Discharge: 2020-04-06 | DRG: 177 | Disposition: A | Discharge status: Home / Self Care | Payer: Medicare Other | Attending: Internal Medicine | Admitting: Internal Medicine

## 2020-04-03 ENCOUNTER — Encounter (HOSPITAL_COMMUNITY): Payer: Self-pay | Admitting: *Deleted

## 2020-04-03 ENCOUNTER — Other Ambulatory Visit: Payer: Self-pay

## 2020-04-03 ENCOUNTER — Emergency Department (HOSPITAL_COMMUNITY): Payer: Medicare Other

## 2020-04-03 DIAGNOSIS — Z6827 Body mass index (BMI) 27.0-27.9, adult: Secondary | ICD-10-CM

## 2020-04-03 DIAGNOSIS — Z7951 Long term (current) use of inhaled steroids: Secondary | ICD-10-CM

## 2020-04-03 DIAGNOSIS — R600 Localized edema: Secondary | ICD-10-CM | POA: Diagnosis present

## 2020-04-03 DIAGNOSIS — R9431 Abnormal electrocardiogram [ECG] [EKG]: Secondary | ICD-10-CM | POA: Diagnosis not present

## 2020-04-03 DIAGNOSIS — I160 Hypertensive urgency: Secondary | ICD-10-CM | POA: Diagnosis present

## 2020-04-03 DIAGNOSIS — U071 COVID-19: Principal | ICD-10-CM | POA: Diagnosis present

## 2020-04-03 DIAGNOSIS — F419 Anxiety disorder, unspecified: Secondary | ICD-10-CM | POA: Diagnosis present

## 2020-04-03 DIAGNOSIS — R112 Nausea with vomiting, unspecified: Secondary | ICD-10-CM | POA: Diagnosis not present

## 2020-04-03 DIAGNOSIS — R110 Nausea: Secondary | ICD-10-CM

## 2020-04-03 DIAGNOSIS — Z79899 Other long term (current) drug therapy: Secondary | ICD-10-CM

## 2020-04-03 DIAGNOSIS — K219 Gastro-esophageal reflux disease without esophagitis: Secondary | ICD-10-CM | POA: Diagnosis present

## 2020-04-03 DIAGNOSIS — M797 Fibromyalgia: Secondary | ICD-10-CM | POA: Diagnosis present

## 2020-04-03 DIAGNOSIS — J45909 Unspecified asthma, uncomplicated: Secondary | ICD-10-CM | POA: Diagnosis present

## 2020-04-03 DIAGNOSIS — Z885 Allergy status to narcotic agent status: Secondary | ICD-10-CM | POA: Diagnosis not present

## 2020-04-03 DIAGNOSIS — I10 Essential (primary) hypertension: Secondary | ICD-10-CM | POA: Diagnosis present

## 2020-04-03 DIAGNOSIS — J1282 Pneumonia due to coronavirus disease 2019: Secondary | ICD-10-CM | POA: Diagnosis present

## 2020-04-03 DIAGNOSIS — I340 Nonrheumatic mitral (valve) insufficiency: Secondary | ICD-10-CM | POA: Diagnosis not present

## 2020-04-03 DIAGNOSIS — Z7952 Long term (current) use of systemic steroids: Secondary | ICD-10-CM | POA: Diagnosis not present

## 2020-04-03 DIAGNOSIS — Z888 Allergy status to other drugs, medicaments and biological substances status: Secondary | ICD-10-CM | POA: Diagnosis not present

## 2020-04-03 DIAGNOSIS — R630 Anorexia: Secondary | ICD-10-CM | POA: Diagnosis present

## 2020-04-03 LAB — CBC WITH DIFFERENTIAL/PLATELET
Abs Immature Granulocytes: 0.14 10*3/uL — ABNORMAL HIGH (ref 0.00–0.07)
Basophils Absolute: 0.1 10*3/uL (ref 0.0–0.1)
Basophils Relative: 1 %
Eosinophils Absolute: 0 10*3/uL (ref 0.0–0.5)
Eosinophils Relative: 0 %
HCT: 42.4 % (ref 36.0–46.0)
Hemoglobin: 14 g/dL (ref 12.0–15.0)
Immature Granulocytes: 1 %
Lymphocytes Relative: 18 %
Lymphs Abs: 1.8 10*3/uL (ref 0.7–4.0)
MCH: 30.8 pg (ref 26.0–34.0)
MCHC: 33 g/dL (ref 30.0–36.0)
MCV: 93.4 fL (ref 80.0–100.0)
Monocytes Absolute: 1.2 10*3/uL — ABNORMAL HIGH (ref 0.1–1.0)
Monocytes Relative: 11 %
Neutro Abs: 7.2 10*3/uL (ref 1.7–7.7)
Neutrophils Relative %: 69 %
Platelets: 340 10*3/uL (ref 150–400)
RBC: 4.54 MIL/uL (ref 3.87–5.11)
RDW: 14.2 % (ref 11.5–15.5)
WBC: 10.4 10*3/uL (ref 4.0–10.5)
nRBC: 0 % (ref 0.0–0.2)

## 2020-04-03 LAB — COMPREHENSIVE METABOLIC PANEL
ALT: 27 U/L (ref 0–44)
AST: 34 U/L (ref 15–41)
Albumin: 3.9 g/dL (ref 3.5–5.0)
Alkaline Phosphatase: 143 U/L — ABNORMAL HIGH (ref 38–126)
Anion gap: 12 (ref 5–15)
BUN: 18 mg/dL (ref 8–23)
CO2: 26 mmol/L (ref 22–32)
Calcium: 9.7 mg/dL (ref 8.9–10.3)
Chloride: 98 mmol/L (ref 98–111)
Creatinine, Ser: 1 mg/dL (ref 0.44–1.00)
GFR, Estimated: >60 mL/min (ref >60)
Glucose, Bld: 85 mg/dL (ref 70–99)
Potassium: 4.7 mmol/L (ref 3.5–5.1)
Sodium: 136 mmol/L (ref 135–145)
Total Bilirubin: 0.6 mg/dL (ref 0.3–1.2)
Total Protein: 8.4 g/dL — ABNORMAL HIGH (ref 6.5–8.1)

## 2020-04-03 LAB — PROCALCITONIN: Procalcitonin: <0.1 ng/mL

## 2020-04-03 LAB — CBG MONITORING, ED
Glucose-Capillary: 86 mg/dL (ref 70–99)
Glucose-Capillary: 90 mg/dL (ref 70–99)

## 2020-04-03 LAB — D-DIMER, QUANTITATIVE: D-Dimer, Quant: 1.45 ug/mL-FEU — ABNORMAL HIGH (ref 0.00–0.50)

## 2020-04-03 LAB — FERRITIN: Ferritin: 1480 ng/mL — ABNORMAL HIGH (ref 11–307)

## 2020-04-03 LAB — RESPIRATORY PANEL BY RT PCR (FLU A&B, COVID)
Influenza A by PCR: NEGATIVE
Influenza B by PCR: NEGATIVE
SARS Coronavirus 2 by RT PCR: POSITIVE — AB

## 2020-04-03 LAB — LIPASE, BLOOD: Lipase: 35 U/L (ref 11–51)

## 2020-04-03 LAB — C-REACTIVE PROTEIN: CRP: 7.5 mg/dL — ABNORMAL HIGH (ref <1.0)

## 2020-04-03 MED ORDER — SODIUM CHLORIDE 0.9 % IV SOLN
100.0000 mg | Freq: Every day | INTRAVENOUS | Status: DC
  Administered 2020-04-04 – 2020-04-06 (×3): 100 mg via INTRAVENOUS
  Filled 2020-04-03 (×4): qty 20

## 2020-04-03 MED ORDER — ONDANSETRON HCL 4 MG PO TABS: 4.0000 mg | ORAL_TABLET | Freq: Four times a day (QID) | ORAL | Status: DC | PRN

## 2020-04-03 MED ORDER — FENTANYL CITRATE (PF) 100 MCG/2ML IJ SOLN
50.0000 ug | Freq: Once | INTRAMUSCULAR | Status: AC
  Administered 2020-04-03: 50 ug via INTRAVENOUS
  Filled 2020-04-03: qty 2

## 2020-04-03 MED ORDER — ENOXAPARIN SODIUM 40 MG/0.4ML ~~LOC~~ SOLN
40.0000 mg | SUBCUTANEOUS | Status: DC
  Administered 2020-04-03 – 2020-04-05 (×3): 40 mg via SUBCUTANEOUS
  Filled 2020-04-03 (×3): qty 0.4

## 2020-04-03 MED ORDER — BUDESONIDE-FORMOTEROL FUMARATE 80-4.5 MCG/ACT IN AERO
2.0000 | INHALATION_SPRAY | Freq: Two times a day (BID) | RESPIRATORY_TRACT | Status: DC
Start: 2020-04-03 — End: 2020-04-03

## 2020-04-03 MED ORDER — SODIUM CHLORIDE 0.9 % IV SOLN
100.0000 mg | INTRAVENOUS | Status: AC
  Administered 2020-04-03 (×2): 100 mg via INTRAVENOUS
  Filled 2020-04-03 (×2): qty 20

## 2020-04-03 MED ORDER — MOMETASONE FURO-FORMOTEROL FUM 100-5 MCG/ACT IN AERO
2.0000 | INHALATION_SPRAY | Freq: Two times a day (BID) | RESPIRATORY_TRACT | Status: DC
  Administered 2020-04-03 – 2020-04-06 (×6): 2 via RESPIRATORY_TRACT
  Filled 2020-04-03: qty 8.8

## 2020-04-03 MED ORDER — HYDRALAZINE HCL 20 MG/ML IJ SOLN: 10.0000 mg | Freq: Once | INTRAMUSCULAR | Status: DC

## 2020-04-03 MED ORDER — SODIUM CHLORIDE 0.9 % IV SOLN: 100.0000 mg | Freq: Every day | INTRAVENOUS | Status: DC

## 2020-04-03 MED ORDER — ZINC SULFATE 220 (50 ZN) MG PO CAPS
220.0000 mg | ORAL_CAPSULE | Freq: Every day | ORAL | Status: DC
  Administered 2020-04-04 – 2020-04-06 (×3): 220 mg via ORAL
  Filled 2020-04-03 (×3): qty 1

## 2020-04-03 MED ORDER — PROMETHAZINE HCL 25 MG/ML IJ SOLN
12.5000 mg | Freq: Four times a day (QID) | INTRAMUSCULAR | Status: DC | PRN
  Administered 2020-04-03: 12.5 mg via INTRAVENOUS
  Filled 2020-04-03: qty 1

## 2020-04-03 MED ORDER — MONTELUKAST SODIUM 10 MG PO TABS
10.0000 mg | ORAL_TABLET | Freq: Every day | ORAL | Status: DC
  Administered 2020-04-03 – 2020-04-05 (×3): 10 mg via ORAL
  Filled 2020-04-03 (×3): qty 1

## 2020-04-03 MED ORDER — ASPIRIN EC 81 MG PO TBEC
81.0000 mg | DELAYED_RELEASE_TABLET | Freq: Every day | ORAL | Status: DC
  Administered 2020-04-04 – 2020-04-06 (×3): 81 mg via ORAL
  Filled 2020-04-03 (×3): qty 1

## 2020-04-03 MED ORDER — SODIUM CHLORIDE 0.9 % IV SOLN: 200.0000 mg | Freq: Once | INTRAVENOUS | Status: DC

## 2020-04-03 MED ORDER — GABAPENTIN 400 MG PO CAPS
400.0000 mg | ORAL_CAPSULE | Freq: Three times a day (TID) | ORAL | Status: DC
  Administered 2020-04-03 – 2020-04-06 (×8): 400 mg via ORAL
  Filled 2020-04-03 (×8): qty 1

## 2020-04-03 MED ORDER — PROCHLORPERAZINE EDISYLATE 10 MG/2ML IJ SOLN
10.0000 mg | Freq: Once | INTRAMUSCULAR | Status: AC
  Administered 2020-04-03: 10 mg via INTRAVENOUS
  Filled 2020-04-03: qty 2

## 2020-04-03 MED ORDER — TIZANIDINE HCL 4 MG PO TABS
4.0000 mg | ORAL_TABLET | Freq: Four times a day (QID) | ORAL | Status: DC
  Administered 2020-04-04 – 2020-04-06 (×10): 4 mg via ORAL
  Filled 2020-04-03: qty 1
  Filled 2020-04-03: qty 2
  Filled 2020-04-03 (×2): qty 1
  Filled 2020-04-03: qty 2
  Filled 2020-04-03 (×5): qty 1
  Filled 2020-04-03: qty 2
  Filled 2020-04-03 (×2): qty 1
  Filled 2020-04-03 (×2): qty 2
  Filled 2020-04-03 (×4): qty 1
  Filled 2020-04-03 (×2): qty 2
  Filled 2020-04-03: qty 1
  Filled 2020-04-03: qty 2
  Filled 2020-04-03: qty 1
  Filled 2020-04-03 (×2): qty 2

## 2020-04-03 MED ORDER — ASCORBIC ACID 500 MG PO TABS
500.0000 mg | ORAL_TABLET | Freq: Every day | ORAL | Status: DC
  Administered 2020-04-04 – 2020-04-06 (×3): 500 mg via ORAL
  Filled 2020-04-03 (×3): qty 1

## 2020-04-03 MED ORDER — KETOROLAC TROMETHAMINE 30 MG/ML IJ SOLN
30.0000 mg | Freq: Once | INTRAMUSCULAR | Status: AC
  Administered 2020-04-03: 30 mg via INTRAVENOUS
  Filled 2020-04-03: qty 1

## 2020-04-03 MED ORDER — ONDANSETRON HCL 4 MG/2ML IJ SOLN
4.0000 mg | Freq: Four times a day (QID) | INTRAMUSCULAR | Status: DC | PRN
  Administered 2020-04-03 – 2020-04-05 (×3): 4 mg via INTRAVENOUS
  Filled 2020-04-03 (×3): qty 2

## 2020-04-03 MED ORDER — NITROGLYCERIN IN D5W 200-5 MCG/ML-% IV SOLN
0.0000 ug/min | INTRAVENOUS | Status: DC
  Administered 2020-04-03 – 2020-04-04 (×2): 5 ug/min via INTRAVENOUS
  Filled 2020-04-03 (×3): qty 250

## 2020-04-03 MED ORDER — HYDRALAZINE HCL 20 MG/ML IJ SOLN
5.0000 mg | Freq: Once | INTRAMUSCULAR | Status: AC
  Administered 2020-04-03: 5 mg via INTRAVENOUS
  Filled 2020-04-03: qty 1

## 2020-04-03 MED ORDER — HYDRALAZINE HCL 20 MG/ML IJ SOLN
5.0000 mg | Freq: Once | INTRAMUSCULAR | Status: DC
  Filled 2020-04-03: qty 1

## 2020-04-03 MED ORDER — ONDANSETRON HCL 4 MG/2ML IJ SOLN
4.0000 mg | Freq: Once | INTRAMUSCULAR | Status: AC
  Administered 2020-04-03: 4 mg via INTRAVENOUS
  Filled 2020-04-03: qty 2

## 2020-04-03 MED ORDER — ALBUTEROL SULFATE HFA 108 (90 BASE) MCG/ACT IN AERS
2.0000 | INHALATION_SPRAY | RESPIRATORY_TRACT | Status: DC | PRN
  Administered 2020-04-03: 2 via RESPIRATORY_TRACT
  Filled 2020-04-03: qty 6.7

## 2020-04-03 MED ORDER — GUAIFENESIN-DM 100-10 MG/5ML PO SYRP: 10.0000 mL | ORAL_SOLUTION | ORAL | Status: DC | PRN

## 2020-04-03 MED ORDER — LORAZEPAM 2 MG/ML IJ SOLN
1.0000 mg | Freq: Once | INTRAMUSCULAR | Status: AC
  Administered 2020-04-03: 1 mg via INTRAVENOUS
  Filled 2020-04-03: qty 1

## 2020-04-03 MED ORDER — SODIUM CHLORIDE 0.9 % IV BOLUS
500.0000 mL | Freq: Once | INTRAVENOUS | Status: AC
  Administered 2020-04-03: 500 mL via INTRAVENOUS

## 2020-04-03 MED ORDER — LORATADINE 10 MG PO TABS
10.0000 mg | ORAL_TABLET | Freq: Every day | ORAL | Status: DC
  Administered 2020-04-03 – 2020-04-05 (×3): 10 mg via ORAL
  Filled 2020-04-03 (×3): qty 1

## 2020-04-03 MED ORDER — LISINOPRIL 5 MG PO TABS
2.5000 mg | ORAL_TABLET | Freq: Every day | ORAL | Status: DC
  Administered 2020-04-04: 2.5 mg via ORAL
  Filled 2020-04-03: qty 1

## 2020-04-03 MED ORDER — HYDROCOD POLST-CPM POLST ER 10-8 MG/5ML PO SUER
5.0000 mL | Freq: Two times a day (BID) | ORAL | Status: DC | PRN
  Administered 2020-04-03: 5 mL via ORAL
  Filled 2020-04-03: qty 5

## 2020-04-03 NOTE — ED Notes (Signed)
PT unable to tolerate PO medications following med admin PT has had 3 episodes of emesis. Zofran IV given

## 2020-04-03 NOTE — ED Notes (Signed)
Pt is aware we need urine sample.  

## 2020-04-03 NOTE — ED Provider Notes (Signed)
[facility] EMERGENCY DEPARTMENT Provider Note   CSN: [id number] Arrival date & time: 04/03/20  1023     History Chief Complaint  Patient presents with  . Covid Positive    Cynthia Yang is a 64 y.o. female diagnosed with COVID-19 infection on 03/23/2020 who presents now with worsening cough, shortness of breath, nausea, vomiting, chills, shaking.  She endorses loss of appetite, denies sore throat, headache.  Denies diarrhea, but does endorse 3 soft stools last night.  Endorses mild dysuria, denies urinary frequency or urgency.  Denies hematuria.  She also endorses bilateral back pain, that has gotten worse since her illness has progressed; her pain is exacerbated by her cough.  She states she was scheduled to receive monoclonal antibody therapy on Tuesday, 03/30/2020, however was unable to make the appointment as her husband passed away that day secondary to metastatic cancer.  She states yesterday was their 50th wedding anniversary (04/02/2020).  She states she just completed a course of azithromycin prescribed to her by her primary care physician.  States this did not relieve her symptoms at all.  I personally reviewed this patient's medical records.  She has history of hypertension, asthma, GERD, anxiety, fibromyalgia, COVID-19 infection, and opioid withdrawal.  HPI     Past Medical History:  Diagnosis Date  . Anxiety   . Asthma   . Fibromyalgia   . GERD (gastroesophageal reflux disease)   . Hypertension     Patient Active Problem List   Diagnosis Date Noted  . Acute respiratory failure with hypoxia (HCC) 12/11/2018  . Asthma exacerbation 12/09/2018  . Hypertension   . GERD (gastroesophageal reflux disease)   . Anxiety   . Hyperglycemia   . Intractable vomiting with nausea   . Hypertensive urgency   . AKI (acute kidney injury) (HCC)   . Hypokalemia   . Opioid use with withdrawal (HCC)   . Hypertensive emergency 01/19/2017  . Acute nonintractable headache   .  Opioid withdrawal [facility])     Past Surgical History:  Procedure Laterality Date  . ELBOW ARTHROSCOPY Left 04/2018  . NECK SURGERY       OB History   No obstetric history on file.     Family History  Problem Relation Age of Onset  . Lung cancer Mother     Social History   Tobacco Use  . Smoking status: Never Smoker  . Smokeless tobacco: Never Used  Vaping Use  . Vaping Use: Never used  Substance Use Topics  . Alcohol use: No  . Drug use: Yes    Types: Benzodiazepines    Comment: stopped taking 2 weeks ago    Home Medications Prior to Admission medications   Medication Sig Start Date End Date Taking? Authorizing Provider  albuterol (PROVENTIL) (2.5 MG/3ML) 0.083% nebulizer solution Take 2.5 mg by nebulization every 6 (six) hours as needed for wheezing or shortness of breath.    [provider]  albuterol (VENTOLIN HFA) 108 (90 Base) MCG/ACT inhaler Inhale 2 puffs into the lungs every 4 (four) hours as needed for wheezing or shortness of breath.    [provider]  amitriptyline (ELAVIL) 75 MG tablet Take 75 mg by mouth at bedtime.    [provider]  amLODipine (NORVASC) 5 MG tablet Take 1 tablet (5 mg total) by mouth daily. 12/12/18   Catarina Hartshorn, MD  clonazePAM (KLONOPIN) 0.5 MG tablet Take 1 tablet (0.5 mg total) by mouth daily. Patient taking differently: Take 1 mg by mouth at  bedtime.  01/23/17 12/09/18  Beaulah Dinning, MD  fluticasone (FLONASE) 50 MCG/ACT nasal spray Place 1 spray into both nostrils daily. 01/06/17   [provider]  gabapentin (NEURONTIN) 400 MG capsule Take 400 mg by mouth 3 (three) times daily.    [provider]  HYDROcodone-homatropine (HYCODAN) 5-1.5 MG/5ML syrup Take 5 mLs by mouth every 4 (four) hours as needed for cough. 12/12/18   Catarina Hartshorn, MD  levocetirizine (XYZAL) 5 MG tablet Take 1 tablet by mouth at bedtime.    [provider]  metoprolol tartrate (LOPRESSOR) 50 MG tablet Take 1  tablet (50 mg total) by mouth 2 (two) times daily. Patient taking differently: Take 100 mg by mouth 2 (two) times daily.  01/22/17   Beaulah Dinning, MD  montelukast (SINGULAIR) 10 MG tablet Take 1 tablet (10 mg total) by mouth at bedtime. 12/12/18   Catarina Hartshorn, MD  predniSONE (DELTASONE) 10 MG tablet Take 6 tablets (60 mg total) by mouth daily with breakfast. And decrease by one tablet daily 12/13/18   Tat, Onalee Hua, MD  SYMBICORT 160-4.5 MCG/ACT inhaler Inhale 2 puffs into the lungs 2 (two) times daily as needed for wheezing. 11/11/16   [provider]  tiZANidine (ZANAFLEX) 4 MG capsule Take 1 tablet by mouth every 6 (six) hours.    [provider]    Allergies    Morphine and related, Trazodone and nefazodone, and Vistaril [hydroxyzine hcl]  Review of Systems   Review of Systems  Constitutional: Positive for activity change, appetite change, chills, diaphoresis, fatigue and fever.  HENT: Positive for congestion and rhinorrhea. Negative for postnasal drip, sore throat, trouble swallowing and voice change.   Eyes: Negative.   Respiratory: Positive for cough, chest tightness and shortness of breath.   Cardiovascular: Negative for chest pain, palpitations and leg swelling.  Gastrointestinal: Positive for nausea and vomiting. Negative for abdominal pain, blood in stool and diarrhea.  Genitourinary: Positive for dysuria. Negative for frequency and urgency.  Musculoskeletal: Positive for back pain.       Bilateral posterior rib pain, exacerbated with coughing.  Skin: Negative.   Neurological: Positive for tremors and weakness. Negative for dizziness, seizures, speech difficulty, light-headedness, numbness and headaches.  Psychiatric/Behavioral: The patient is nervous/anxious.        Husband passed away on Tuesday, 04/21/20.    Physical Exam Updated Vital Signs BP (!) 216/63   Pulse 69   Temp 98.8 F (37.1 C)   Resp 13   Ht 5\' 3"  (1.6 m)   SpO2 100%   BMI 31.32 kg/m    Physical Exam Vitals and nursing note reviewed.  HENT:     Head: Normocephalic and atraumatic.     Nose: Rhinorrhea present.     Mouth/Throat:     Mouth: Mucous membranes are moist.     Pharynx: Oropharynx is clear. Uvula midline. No oropharyngeal exudate or posterior oropharyngeal erythema.     Tonsils: No tonsillar exudate.  Eyes:     General:        Right eye: No discharge.        Left eye: No discharge.     Conjunctiva/sclera: Conjunctivae normal.     Pupils: Pupils are equal, round, and reactive to light.  Cardiovascular:     Rate and Rhythm: Normal rate and regular rhythm.     Pulses: Normal pulses.          Radial pulses are 2+ on the right side and 2+ on the left  side.       Dorsalis pedis pulses are 2+ on the right side and 2+ on the left side.     Heart sounds: Normal heart sounds. No murmur heard.   Pulmonary:     Effort: Pulmonary effort is normal. Tachypnea present. No prolonged expiration or respiratory distress.     Breath sounds: Decreased air movement present. Examination of the right-lower field reveals decreased breath sounds. Examination of the left-lower field reveals decreased breath sounds. Decreased breath sounds present. No wheezing or rales.     Comments: Coarse breath sounds throughout Chest:     Chest wall: No tenderness or crepitus.  Abdominal:     General: Bowel sounds are normal. There is no distension.     Tenderness: There is no abdominal tenderness.  Musculoskeletal:        General: No deformity.     Cervical back: Neck supple. No crepitus. No pain with movement, spinous process tenderness or muscular tenderness.       Back:     Right lower leg: No edema.     Left lower leg: No edema.  Lymphadenopathy:     Cervical: No cervical adenopathy.  Skin:    General: Skin is warm and dry.     Capillary Refill: Capillary refill takes less than 2 seconds.  Neurological:     General: No focal deficit present.     Mental Status: She is alert.  Mental status is at baseline.     Sensory: Sensation is intact.     Motor: Motor function is intact.  Psychiatric:        Mood and Affect: Mood normal.     ED Results / Procedures / Treatments   Labs (all labs ordered are listed, but only abnormal results are displayed) Labs Reviewed  RESPIRATORY PANEL BY RT PCR (FLU A&B, COVID) - Abnormal; Notable for the following components:      Result Value   SARS Coronavirus 2 by RT PCR POSITIVE (*)    All other components within normal limits  COMPREHENSIVE METABOLIC PANEL - Abnormal; Notable for the following components:   Total Protein 8.4 (*)    Alkaline Phosphatase 143 (*)    All other components within normal limits  CBC WITH DIFFERENTIAL/PLATELET - Abnormal; Notable for the following components:   Monocytes Absolute 1.2 (*)    Abs Immature Granulocytes 0.14 (*)    All other components within normal limits  LIPASE, BLOOD  URINALYSIS, ROUTINE W REFLEX MICROSCOPIC  CBG MONITORING, ED  CBG MONITORING, ED    EKG EKG Interpretation  Date/Time:  Saturday April 03 2020 13:33:26 EDT Ventricular Rate:  63 PR Interval:    QRS Duration: 94 QT Interval:  479 QTC Calculation: 491 R Axis:   35 Text Interpretation: Sinus rhythm Borderline short PR interval Borderline prolonged QT interval No STEMI Confirmed by Alona BeneLong, Joshua [phone]([id number]) on 04/03/2020 1:38:26 PM   Radiology DG Chest Port 1 View  Result Date: 04/03/2020 CLINICAL DATA:  Shortness of breath.  Recent COVID 19 diagnosis. EXAM: PORTABLE CHEST 1 VIEW COMPARISON:  CTA chest and chest x-ray dated December 09, 2018. FINDINGS: The heart size and mediastinal contours are within normal limits. Normal pulmonary vascularity. Mild patchy peripheral hazy interstitial and airspace opacities at the left greater than right lung bases. No pleural effusion or pneumothorax. No acute osseous abnormality. Spinal stimulator noted. IMPRESSION: 1. Mild patchy peripheral hazy interstitial and airspace opacities  at the left greater than right lung bases, consistent with multifocal  COVID 19 pneumonia given clinical history. Electronically Signed   By: Obie Dredge M.D.   On: 04/03/2020 14:26    Procedures Procedures (including critical care time)  Medications Ordered in ED Medications  nitroGLYCERIN 50 mg in dextrose 5 % 250 mL (0.2 mg/mL) infusion (5 mcg/min Intravenous New Bag/Given 04/03/20 1918)  ondansetron (ZOFRAN) injection 4 mg (4 mg Intravenous Given 04/03/20 1402)  fentaNYL (SUBLIMAZE) injection 50 mcg (50 mcg Intravenous Given 04/03/20 1403)  prochlorperazine (COMPAZINE) injection 10 mg (10 mg Intravenous Given 04/03/20 1713)  sodium chloride 0.9 % bolus 500 mL (0 mLs Intravenous Stopped 04/03/20 1800)  LORazepam (ATIVAN) injection 1 mg (1 mg Intravenous Given 04/03/20 1841)  ketorolac (TORADOL) 30 MG/ML injection 30 mg (30 mg Intravenous Given 04/03/20 1842)  hydrALAZINE (APRESOLINE) injection 5 mg (5 mg Intravenous Given 04/03/20 1843)    ED Course  I have reviewed the triage vital signs and the nursing notes.  Pertinent labs & imaging results that were available during my care of the patient were reviewed by me and considered in my medical decision making (see chart for details).    MDM Rules/Calculators/A&P                         64 year old female who presents with known COVID-19 infection diagnosed on 03/23/2020, presenting with worsening shortness of breath at rest, intractable nausea with vomiting.  Patient was not vaccinated against COVID-19.   Patient is mildly hypertensive on intake 140/97.  Vital signs otherwise normal.  Patient with O2 sat of 94% on room air.  At the time of my initial exam of the patient she is vomiting into an emesis bag, she is tachypneic, diaphoretic.  Physical exam concerning for tachypnea, coarse diminished breath sounds throughout lung fields, worse in bases.  Analgesia and antiemetic offered.  Respiratory pathogen panel positive for COVID-19.   Negative for influenza A/B.  EKG normal sinus rhythm, no STEMI.  Chest x-ray Mild patchy peripheral hazy interstitial and airspace opacities at the left > right lung bases, consistent with multifocal COVID-19 pneumonia.  CBC, CMP, lipase, UA pending.  CBC unremarkable, CMP unremarkable, lipase normal, 35.  CBG 86.  The time of my reevaluation the patient she is lying in hospital bed, she reports that her nausea initially improved after Zofran, however is now worsening once again.  Her blood pressures at this time is 216/72, patient is dry heaving into an emesis bag while I am in the room.  Will order antiemetic.  Patient administered hydralazine for blood pressure.  Patient remains extremely nauseous, vomiting in her bed despite multiple antiemetics.  Will administer Ativan.  Patient remains nauseous, vomiting, hypertensive despite multiple antiemetic medications, BP poorly controlled on hydralazine, remains >200 systolic.  I discussed with my attending physician, who agrees that this patient would benefit from admission to the hospital for management of her intractable nausea as well as her blood pressure.  Discussed this treatment plan with Cynthia Yang who voiced understanding of her medical evaluation and plan for admission.  She is agreeable.  Each of her questions were answered to her expressed a dissection.  Consult placed to hospitalist, Dr. Adrian Blackwater, who was agreeable to admitting this patient to the hospital.  I appreciate his willingness to collaborate in the care of this patient.  Cynthia Yang was evaluated in Emergency Department on 04/03/2020 for the symptoms described in the history of present illness. She was evaluated in the context of the global COVID-19 pandemic,  which necessitated consideration that the patient might be at risk for infection with the SARS-CoV-2 virus that causes COVID-19. Institutional protocols and algorithms that pertain to the evaluation of patients at  risk for COVID-19 are in a state of rapid change based on information released by regulatory bodies including the CDC and federal and state organizations. These policies and algorithms were followed during the patient's care in the ED.  Final Clinical Impression(s) / ED Diagnoses Final diagnoses:  Hypertensive urgency  Nausea  COVID-19    Rx / DC Orders ED Discharge Orders    None       Sherrilee Gilles 04/03/20 Shanna Cisco, MD 04/04/20 [phone]

## 2020-04-03 NOTE — H&P (Signed)
History and Physical  Cynthia Yang MVH:[id number] DOB: [birth year] DOA: 04/03/2020  Referring physician: Loleta Dickerebekah Sponseller, PA-C, EDP PCP: Catha NottinghamKeith, Amanda L, FNP  Outpatient Specialists:   Patient Coming From: home  Chief Complaint: nausea and vomiting  HPI: Cynthia Yang is a 64 y.o. female with a history of GERD, hypertension, asthma.  Patient presents with nausea and vomiting over the past 2 to 3 days.  Patient was diagnosed with COVID on 10/26.  She began having fevers and chills and nonproductive cough shortly thereafter which is continued.  She does have some shortness of breath with dyspnea on exertion.  Emesis is described as stomach contents and bile.  Denies abdominal pain.  Symptoms are worsening.  She states that she cannot keep anything down and has lack of appetite.  The patient was going to get monoclonal antibodies this past Tuesday, although her husband passed away that day due to brain cancer, and she was unable to get the antibodies.  Emergency Department Course: Blood pressures have been extremely elevated: 216/72.  EDP gave her 2 doses of hydralazine around 6:45 PM and 7:00.  Chest x-ray shows multifocal pneumonia consistent with Covid pneumonia.  White count 10.  CMP relatively normal.  Covid positive  Review of Systems:   Pt denies any diarrhea, constipation, abdominal pain, orthopnea, wheezing, palpitations, headache, vision changes, lightheadedness, dizziness, melena, rectal bleeding.  Review of systems are otherwise negative  Past Medical History:  Diagnosis Date  . Anxiety   . Asthma   . Fibromyalgia   . GERD (gastroesophageal reflux disease)   . Hypertension    Past Surgical History:  Procedure Laterality Date  . ELBOW ARTHROSCOPY Left 04/2018  . NECK SURGERY     Social History:  reports that she has never smoked. She has never used smokeless tobacco. She reports current drug use. Drug: Benzodiazepines. She reports that she does not drink  alcohol. Patient lives at home  Allergies  Allergen Reactions  . Other Anaphylaxis  . Morphine And Related Other (See Comments)    "Shakes real bad"  . Trazodone And Nefazodone     Fast heart rate  . Vistaril [Hydroxyzine Hcl] Other (See Comments)    Stops breathing  . Duloxetine Hcl Palpitations  . Trazodone Palpitations    Family History  Problem Relation Age of Onset  . Lung cancer Mother       Prior to Admission medications   Medication Sig Start Date End Date Taking? Authorizing Provider  albuterol (PROVENTIL) (2.5 MG/3ML) 0.083% nebulizer solution Take 2.5 mg by nebulization every 6 (six) hours as needed for wheezing or shortness of breath.   Yes [provider]  albuterol (VENTOLIN HFA) 108 (90 Base) MCG/ACT inhaler Inhale 2 puffs into the lungs every 4 (four) hours as needed for wheezing or shortness of breath.   Yes [provider]  amitriptyline (ELAVIL) 75 MG tablet Take 75 mg by mouth at bedtime.   Yes [provider]  BUDESONIDE-FORMOTEROL FUMARATE IN 1 ea 04/02/20  Yes [provider]  fluticasone (FLONASE) 50 MCG/ACT nasal spray Place 1 spray into both nostrils daily. 01/06/17  Yes [provider]  gabapentin (NEURONTIN) 400 MG capsule Take 400 mg by mouth 3 (three) times daily.   Yes [provider]  levocetirizine (XYZAL) 5 MG tablet Take 1 tablet by mouth at bedtime.   Yes [provider]  metoprolol tartrate (LOPRESSOR) 50 MG tablet Take 1 tablet (50 mg total) by mouth 2 (two) times daily.  Patient taking differently: Take 100 mg by mouth 2 (two) times daily.  01/22/17  Yes Beaulah Dinning, MD  montelukast (SINGULAIR) 10 MG tablet Take 1 tablet (10 mg total) by mouth at bedtime. 12/12/18  Yes Tat, Onalee Hua, MD  tiZANidine (ZANAFLEX) 4 MG capsule Take 1 tablet by mouth every 6 (six) hours.   Yes [provider]  amLODipine (NORVASC) 5 MG tablet Take 1 tablet (5 mg total) by mouth daily. Patient  not taking: Reported on 04/03/2020 12/12/18   Catarina Hartshorn, MD  azithromycin (ZITHROMAX) 250 MG tablet Take 250 mg by mouth as directed. 03/26/20   [provider]  clonazePAM (KLONOPIN) 0.5 MG tablet Take 1 tablet (0.5 mg total) by mouth daily. Patient not taking: Reported on 04/03/2020 01/23/17 12/09/18  Beaulah Dinning, MD  HYDROcodone-homatropine [facility]) 5-1.5 MG/5ML syrup Take 5 mLs by mouth every 4 (four) hours as needed for cough. Patient not taking: Reported on 04/03/2020 12/12/18   Catarina Hartshorn, MD  lisinopril (ZESTRIL) 2.5 MG tablet Take 2.5 mg by mouth daily. 03/22/20   [provider]  predniSONE (DELTASONE) 10 MG tablet Take 6 tablets (60 mg total) by mouth daily with breakfast. And decrease by one tablet daily Patient not taking: Reported on 04/03/2020 12/13/18   Catarina Hartshorn, MD  SYMBICORT 160-4.5 MCG/ACT inhaler Inhale 2 puffs into the lungs 2 (two) times daily as needed for wheezing. Patient not taking: Reported on 04/03/2020 11/11/16   [provider]    Physical Exam: BP (!) 193/71 (BP Location: Left Arm)   Pulse 78   Temp 98.4 F (36.9 C) (Oral)   Resp (!) 22   Ht 5\' 3"  (1.6 m)   SpO2 95%   BMI 31.32 kg/m   . General: Elderly female. Awake and alert and oriented x3. No acute cardiopulmonary distress.  HEENT: Normocephalic atraumatic.  Right and left ears normal in appearance.  Pupils equal, round, reactive to light. Extraocular muscles are intact. Sclerae anicteric and noninjected.  Moist mucosal membranes. No mucosal lesions.  . Neck: Neck supple without lymphadenopathy. No carotid bruits. No masses palpated.  . Cardiovascular: Regular rate with normal S1-S2 sounds. No murmurs, rubs, gallops auscultated. No JVD.  Marland Kitchen Respiratory: Diffuse Rales. . Abdomen: Soft, nontender, nondistended. Active bowel sounds. No masses or hepatosplenomegaly  . Skin: No rashes, lesions, or ulcerations.  Dry, warm to touch. 2+ dorsalis pedis and radial  pulses. . Musculoskeletal: No calf or leg pain. All major joints not erythematous nontender.  No upper or lower joint deformation.  Good ROM.  No contractures  . Psychiatric: Intact judgment and insight. Pleasant and cooperative. . Neurologic: No focal neurological deficits. Strength is 5/5 and symmetric in upper and lower extremities.  Cranial nerves II through XII are grossly intact.           Labs on Admission: I have personally reviewed following labs and imaging studies  CBC: Recent Labs  Lab 04/03/20 1349  WBC 10.4  NEUTROABS 7.2  HGB 14.0  HCT 42.4  MCV 93.4  PLT 340   Basic Metabolic Panel: Recent Labs  Lab 04/03/20 1349  NA 136  K 4.7  CL 98  CO2 26  GLUCOSE 85  BUN 18  CREATININE 1.00  CALCIUM 9.7   GFR: CrCl cannot be calculated (Unknown ideal weight.). Liver Function Tests: Recent Labs  Lab 04/03/20 1349  AST 34  ALT 27  ALKPHOS 143*  BILITOT 0.6  PROT 8.4*  ALBUMIN 3.9   Recent Labs  Lab 04/03/20 1349  LIPASE 35   No results for input(s): AMMONIA in the last 168 hours. Coagulation Profile: No results for input(s): INR, PROTIME in the last 168 hours. Cardiac Enzymes: No results for input(s): CKTOTAL, CKMB, CKMBINDEX, TROPONINI in the last 168 hours. BNP (last 3 results) No results for input(s): PROBNP in the last 8760 hours. HbA1C: No results for input(s): HGBA1C in the last 72 hours. CBG: Recent Labs  Lab 04/03/20 1334 04/03/20 1925  GLUCAP 86 90   Lipid Profile: No results for input(s): CHOL, HDL, LDLCALC, TRIG, CHOLHDL, LDLDIRECT in the last 72 hours. Thyroid Function Tests: No results for input(s): TSH, T4TOTAL, FREET4, T3FREE, THYROIDAB in the last 72 hours. Anemia Panel: No results for input(s): VITAMINB12, FOLATE, FERRITIN, TIBC, IRON, RETICCTPCT in the last 72 hours. Urine analysis: No results found for: COLORURINE, APPEARANCEUR, LABSPEC, PHURINE, GLUCOSEU, HGBUR, BILIRUBINUR, KETONESUR, PROTEINUR, UROBILINOGEN, NITRITE,  LEUKOCYTESUR Sepsis Labs: @LABRCNTIP (procalcitonin:4,lacticidven:4) ) Recent Results (from the past 240 hour(s))  Respiratory Panel by RT PCR (Flu A&B, Covid) - Nasopharyngeal Swab     Status: Abnormal   Collection Time: 04/03/20 10:43 AM   Specimen: Nasopharyngeal Swab  Result Value Ref Range Status   SARS Coronavirus 2 by RT PCR POSITIVE (A) NEGATIVE Final    Comment: CRITICAL RESULT CALLED TO, READ BACK BY AND VERIFIED WITH: MACI BRAME,RN @1320  03/03/2020 KAY (NOTE) SARS-CoV-2 target nucleic acids are DETECTED.  SARS-CoV-2 RNA is generally detectable in upper respiratory specimens  during the acute phase of infection. Positive results are indicative of the presence of the identified virus, but do not rule out bacterial infection or co-infection with other pathogens not detected by the test. Clinical correlation with patient history and other diagnostic information is necessary to determine patient infection status. The expected result is Negative.  Fact Sheet for Patients:   Fact Sheet for Healthcare Providers: 05/03/2020  This test is not yet approved or cleared by the https://www.moore.com/ FDA and  has been authorized for detection and/or diagnosis of SARS-CoV-2 by FDA under an Emergency Use Authorization (EUA).  This EUA will remain in effect (meaning this tes t can be used) for the duration of  the COVID-19 declaration under Section 564(b)(1) of the Act, 21 U.S.C. section 360bbb-3(b)(1), unless the authorization is terminated or revoked sooner.      Influenza A by PCR NEGATIVE NEGATIVE Final   Influenza B by PCR NEGATIVE NEGATIVE Final    Comment: (NOTE) The Xpert Xpress SARS-CoV-2/FLU/RSV assay is intended as an aid in  the diagnosis of influenza from Nasopharyngeal swab specimens and  should not be used as a sole basis for treatment. Nasal washings and  aspirates are unacceptable for Xpert Xpress  SARS-CoV-2/FLU/RSV  testing.  Fact Sheet for Patients: https://www.young.biz/  Fact Sheet for Healthcare Providers: [place]  This test is not yet approved or cleared by the https://www.moore.com/ FDA and  has been authorized for detection and/or diagnosis of SARS-CoV-2 by  FDA under an Emergency Use Authorization (EUA). This EUA will remain  in effect (meaning this test can be used) for the duration of the  Covid-19 declaration under Section 564(b)(1) of the Act, 21  U.S.C. section 360bbb-3(b)(1), unless the authorization is  terminated or revoked. Performed at [facility], [address], [place], [address] [place]      Radiological Exams on Admission: DG Chest [facility] 1 View  Result Date: 04/03/2020 CLINICAL DATA:  Shortness of breath.  Recent COVID 19 diagnosis. EXAM: PORTABLE CHEST 1 VIEW COMPARISON:  CTA chest and chest x-ray dated December 09, 2018. FINDINGS: The heart size and mediastinal contours are within normal limits. Normal pulmonary vascularity. Mild patchy peripheral hazy interstitial and airspace opacities at the left greater than right lung bases. No pleural effusion or pneumothorax. No acute osseous abnormality. Spinal stimulator noted. IMPRESSION: 1. Mild patchy peripheral hazy interstitial and airspace opacities at the left greater than right lung bases, consistent with multifocal COVID 19 pneumonia given clinical history. Electronically Signed   By: Obie Dredge M.D.   On: 04/03/2020 14:26    EKG: Independently reviewed.  Sinus rhythm with borderline PR prolongation and borderline prolonged QTC.  Assessment/Plan: Principal Problem:   Intractable vomiting with nausea Active Problems:   Hypertensive urgency   GERD (gastroesophageal reflux disease)   Pneumonia due to COVID-19 virus   Asthma    This patient was discussed with the ED physician, including pertinent vitals, physical exam findings, labs, and imaging.  We also  discussed care given by the ED provider.  1. Pneumonia due to COVID-19 Daily labs: CBC, CMP, CRP, D-dimer, ferritin, magnesium, phosphorus Start remdesivir Proning Supplemental oxygen Zinc and vitamin C Albuterol HFA as needed Antitussives 2. Intractable vomiting with nausea a. Continue Zofran 3. Hypertensive urgency a. Nitro drip 4. Asthma a. Continue long-acting bronchodilator, Spiriva 5. GERD  DVT prophylaxis: lovenox Consultants: none Code Status: full Family Communication: none  Disposition Plan: Patient should be able to return home following admission.   Levie Heritage, DO

## 2020-04-03 NOTE — ED Notes (Signed)
Started Dulera mdi and albuterol mdi with spacer device. Inhaler hanging on bed.

## 2020-04-03 NOTE — ED Triage Notes (Signed)
Tested positive for covid at home on 10/26. States she just finished a z pack. Just not getting better and advised by PCP to come in for further evaluation

## 2020-04-03 NOTE — ED Notes (Signed)
Pt to Xray.

## 2020-04-04 LAB — CBC WITH DIFFERENTIAL/PLATELET
Abs Immature Granulocytes: 0.14 10*3/uL — ABNORMAL HIGH (ref 0.00–0.07)
Basophils Absolute: 0 10*3/uL (ref 0.0–0.1)
Basophils Relative: 0 %
Eosinophils Absolute: 0 10*3/uL (ref 0.0–0.5)
Eosinophils Relative: 0 %
HCT: 31.7 % — ABNORMAL LOW (ref 36.0–46.0)
Hemoglobin: 10.5 g/dL — ABNORMAL LOW (ref 12.0–15.0)
Immature Granulocytes: 2 %
Lymphocytes Relative: 12 %
Lymphs Abs: 1 10*3/uL (ref 0.7–4.0)
MCH: 31.2 pg (ref 26.0–34.0)
MCHC: 33.1 g/dL (ref 30.0–36.0)
MCV: 94.1 fL (ref 80.0–100.0)
Monocytes Absolute: 1.4 10*3/uL — ABNORMAL HIGH (ref 0.1–1.0)
Monocytes Relative: 16 %
Neutro Abs: 6 10*3/uL (ref 1.7–7.7)
Neutrophils Relative %: 70 %
Platelets: 343 10*3/uL (ref 150–400)
RBC: 3.37 MIL/uL — ABNORMAL LOW (ref 3.87–5.11)
RDW: 14.5 % (ref 11.5–15.5)
WBC: 8.5 10*3/uL (ref 4.0–10.5)
nRBC: 0 % (ref 0.0–0.2)

## 2020-04-04 LAB — D-DIMER, QUANTITATIVE: D-Dimer, Quant: 0.98 ug/mL-FEU — ABNORMAL HIGH (ref 0.00–0.50)

## 2020-04-04 LAB — COMPREHENSIVE METABOLIC PANEL
ALT: 20 U/L (ref 0–44)
AST: 24 U/L (ref 15–41)
Albumin: 3.1 g/dL — ABNORMAL LOW (ref 3.5–5.0)
Alkaline Phosphatase: 103 U/L (ref 38–126)
Anion gap: 14 (ref 5–15)
BUN: 26 mg/dL — ABNORMAL HIGH (ref 8–23)
CO2: 22 mmol/L (ref 22–32)
Calcium: 8.4 mg/dL — ABNORMAL LOW (ref 8.9–10.3)
Chloride: 102 mmol/L (ref 98–111)
Creatinine, Ser: 1.18 mg/dL — ABNORMAL HIGH (ref 0.44–1.00)
GFR, Estimated: 51 mL/min — ABNORMAL LOW (ref >60)
Glucose, Bld: 98 mg/dL (ref 70–99)
Potassium: 4 mmol/L (ref 3.5–5.1)
Sodium: 138 mmol/L (ref 135–145)
Total Bilirubin: 0.7 mg/dL (ref 0.3–1.2)
Total Protein: 6.6 g/dL (ref 6.5–8.1)

## 2020-04-04 LAB — URINALYSIS, ROUTINE W REFLEX MICROSCOPIC
Bacteria, UA: NONE SEEN
Bilirubin Urine: NEGATIVE
Glucose, UA: NEGATIVE mg/dL
Hgb urine dipstick: NEGATIVE
Ketones, ur: 5 mg/dL — AB
Nitrite: NEGATIVE
Protein, ur: 100 mg/dL — AB
Specific Gravity, Urine: 1.028 (ref 1.005–1.030)
WBC, UA: >50 WBC/hpf — ABNORMAL HIGH (ref 0–5)
pH: 5 (ref 5.0–8.0)

## 2020-04-04 LAB — C-REACTIVE PROTEIN: CRP: 3.8 mg/dL — ABNORMAL HIGH (ref <1.0)

## 2020-04-04 LAB — MRSA PCR SCREENING: MRSA by PCR: NEGATIVE

## 2020-04-04 LAB — HIV ANTIBODY (ROUTINE TESTING W REFLEX): HIV Screen 4th Generation wRfx: NONREACTIVE

## 2020-04-04 LAB — FERRITIN: Ferritin: 1108 ng/mL — ABNORMAL HIGH (ref 11–307)

## 2020-04-04 MED ORDER — LISINOPRIL 10 MG PO TABS: 10.0000 mg | ORAL_TABLET | Freq: Every day | ORAL | Status: DC

## 2020-04-04 MED ORDER — LACTATED RINGERS IV SOLN: INTRAVENOUS | Status: AC

## 2020-04-04 MED ORDER — LABETALOL HCL 5 MG/ML IV SOLN
10.0000 mg | INTRAVENOUS | Status: DC | PRN
  Administered 2020-04-04 – 2020-04-05 (×5): 10 mg via INTRAVENOUS
  Filled 2020-04-04 (×5): qty 4

## 2020-04-04 MED ORDER — CHLORHEXIDINE GLUCONATE CLOTH 2 % EX PADS
6.0000 | MEDICATED_PAD | Freq: Every day | CUTANEOUS | Status: DC
  Administered 2020-04-04 – 2020-04-05 (×2): 6 via TOPICAL

## 2020-04-04 MED ORDER — HYDRALAZINE HCL 10 MG PO TABS
10.0000 mg | ORAL_TABLET | Freq: Three times a day (TID) | ORAL | Status: DC
  Administered 2020-04-04 – 2020-04-05 (×3): 10 mg via ORAL
  Filled 2020-04-04 (×3): qty 1

## 2020-04-04 MED ORDER — ACETAMINOPHEN 325 MG PO TABS
650.0000 mg | ORAL_TABLET | Freq: Once | ORAL | Status: AC
  Administered 2020-04-04: 650 mg via ORAL
  Filled 2020-04-04: qty 2

## 2020-04-04 MED ORDER — AMLODIPINE BESYLATE 5 MG PO TABS
5.0000 mg | ORAL_TABLET | Freq: Every day | ORAL | Status: DC
  Filled 2020-04-04: qty 1

## 2020-04-04 MED ORDER — METOPROLOL TARTRATE 50 MG PO TABS
100.0000 mg | ORAL_TABLET | Freq: Two times a day (BID) | ORAL | Status: DC
  Administered 2020-04-04 – 2020-04-06 (×5): 100 mg via ORAL
  Filled 2020-04-04 (×5): qty 2

## 2020-04-04 MED ORDER — PANTOPRAZOLE SODIUM 40 MG IV SOLR
40.0000 mg | INTRAVENOUS | Status: DC
  Administered 2020-04-04 – 2020-04-05 (×2): 40 mg via INTRAVENOUS
  Filled 2020-04-04 (×2): qty 40

## 2020-04-04 MED ORDER — HYDROCODONE-ACETAMINOPHEN 5-325 MG PO TABS
1.0000 | ORAL_TABLET | Freq: Once | ORAL | Status: AC | PRN
  Administered 2020-04-04: 1 via ORAL
  Filled 2020-04-04: qty 1

## 2020-04-04 NOTE — Progress Notes (Signed)
MD aware pt's SBP is between 180s-200s; nurse gave PRN IV Labetalol; orders obtained for scheduled PO hydralazine Q8 in addition to the other scheduled PO BP meds; nurse will continue to monitor.

## 2020-04-04 NOTE — Progress Notes (Signed)
PROGRESS NOTE    Cynthia Yang  OHY:[id number] DOB: 12-Mar-1956 DOA: 04/03/2020 PCP: Catha Nottingham, FNP   Brief Narrative:  Per HPI: Cynthia Yang is a 64 y.o. female with a history of GERD, hypertension, asthma.  Patient presents with nausea and vomiting over the past 2 to 3 days.  Patient was diagnosed with COVID on 10/26.  She began having fevers and chills and nonproductive cough shortly thereafter which is continued.  She does have some shortness of breath with dyspnea on exertion.  Emesis is described as stomach contents and bile.  Denies abdominal pain.  Symptoms are worsening.  She states that she cannot keep anything down and has lack of appetite.  The patient was going to get monoclonal antibodies this past Tuesday, although her husband passed away that day due to brain cancer, and she was unable to get the antibodies.  Emergency Department Course: Blood pressures have been extremely elevated: 216/72.  EDP gave her 2 doses of hydralazine around 6:45 PM and 7:00.  Chest x-ray shows multifocal pneumonia consistent with Covid pneumonia.  White count 10.  CMP relatively normal.  Covid positive   Assessment & Plan:   Principal Problem:   Intractable vomiting with nausea Active Problems:   Hypertensive urgency   GERD (gastroesophageal reflux disease)   Pneumonia due to COVID-19 virus   Asthma   COVID-19 pneumonia with associated intractable nausea and vomiting -Continue current treatments -Downward inflammatory trend noted, will avoid steroids -Continue other measures as ordered with remdesivir  Uncontrolled hypertension -Initially presenting as hypertensive urgency with nitroglycerin drip weaned off -Plan to trial oral medications to help control blood pressure readings -Home lisinopril dose increased and resumed on oral metoprolol.  Added hydralazine 10 mg 3 times daily as well -Patient cannot tolerate amlodipine due to lower extremity edema -IV pushes of  labetalol as needed -May need to restart nitroglycerin drip as needed for significant blood pressure elevations  History of asthma -Continue Spiriva and breathing treatments as needed  GERD -Start PPI IV daily for now   DVT prophylaxis:Lovenox Code Status: Full Family Communication: Discussed with daughter on phone 11/7 Disposition Plan:  Status is: Inpatient  Remains inpatient appropriate because:IV treatments appropriate due to intensity of illness or inability to take PO and Inpatient level of care appropriate due to severity of illness   Dispo: The patient is from: Home              Anticipated d/c is to: Home              Anticipated d/c date is: 1 day              Patient currently is not medically stable to d/c.   Consultants:   None  Procedures:   See below  Antimicrobials:  Anti-infectives (From admission, onward)   Start     Dose/Rate Route Frequency Ordered Stop   04/04/20 1000  remdesivir 100 mg in sodium chloride 0.9 % 100 mL IVPB  Status:  Discontinued       "Followed by" Linked Group Details   100 mg 200 mL/hr over 30 Minutes Intravenous Daily 04/03/20 2048 04/03/20 2055   04/04/20 1000  remdesivir 100 mg in sodium chloride 0.9 % 100 mL IVPB        100 mg 200 mL/hr over 30 Minutes Intravenous Daily 04/03/20 2056 04/08/20 0959   04/03/20 2100  remdesivir 100 mg in sodium chloride 0.9 % 100 mL IVPB  100 mg 200 mL/hr over 30 Minutes Intravenous Every 1 hr x 2 04/03/20 2055 04/03/20 2125   04/03/20 2048  remdesivir 200 mg in sodium chloride 0.9% 250 mL IVPB  Status:  Discontinued       "Followed by" Linked Group Details   200 mg 580 mL/hr over 30 Minutes Intravenous Once 04/03/20 2048 04/03/20 2055       Subjective: Patient seen and evaluated today with no new acute complaints or concerns. No acute concerns or events noted overnight.  She has less nausea and vomiting and is able to tolerate more of her diet.  Her blood pressures unfortunately  remain uncontrolled.  Objective: Vitals:   04/04/20 1000 04/04/20 1008 04/04/20 1030 04/04/20 1121  BP: (!) 96/41 (!) 123/53 (!) 135/45   Pulse: 85 95 95 71  Resp: (!) 24 (!) 23 20 (!) 22  Temp:    98.3 F (36.8 C)  TempSrc:    Oral  SpO2: 91% 92% 94% 96%  Weight:      Height:        Intake/Output Summary (Last 24 hours) at 04/04/2020 1356 Last data filed at 04/04/2020 0800 Gross per 24 hour  Intake 823.63 ml  Output --  Net 823.63 ml   Filed Weights   04/04/20 0902  Weight: 69.5 kg    Examination:  General exam: Appears calm and comfortable  Respiratory system: Clear to auscultation. Respiratory effort normal. Cardiovascular system: S1 & S2 heard, RRR.  Gastrointestinal system: Abdomen is nondistended, soft and nontender.  Central nervous system: Alert and oriented. No focal neurological deficits. Extremities: Symmetric 5 x 5 power. Skin: No rashes, lesions or ulcers Psychiatry: Judgement and insight appear normal. Mood & affect appropriate.     Data Reviewed: I have personally reviewed following labs and imaging studies  CBC: Recent Labs  Lab 04/03/20 1349 04/04/20 0455  WBC 10.4 8.5  NEUTROABS 7.2 6.0  HGB 14.0 10.5*  HCT 42.4 31.7*  MCV 93.4 94.1  PLT 340 343   Basic Metabolic Panel: Recent Labs  Lab 04/03/20 1349 04/04/20 0455  NA 136 138  K 4.7 4.0  CL 98 102  CO2 26 22  GLUCOSE 85 98  BUN 18 26*  CREATININE 1.00 1.18*  CALCIUM 9.7 8.4*   GFR: Estimated Creatinine Clearance: 44.4 mL/min (A) (by C-G formula based on SCr of 1.18 mg/dL (H)). Liver Function Tests: Recent Labs  Lab 04/03/20 1349 04/04/20 0455  AST 34 24  ALT 27 20  ALKPHOS 143* 103  BILITOT 0.6 0.7  PROT 8.4* 6.6  ALBUMIN 3.9 3.1*   Recent Labs  Lab 04/03/20 1349  LIPASE 35   No results for input(s): AMMONIA in the last 168 hours. Coagulation Profile: No results for input(s): INR, PROTIME in the last 168 hours. Cardiac Enzymes: No results for input(s):  CKTOTAL, CKMB, CKMBINDEX, TROPONINI in the last 168 hours. BNP (last 3 results) No results for input(s): PROBNP in the last 8760 hours. HbA1C: No results for input(s): HGBA1C in the last 72 hours. CBG: Recent Labs  Lab 04/03/20 1334 04/03/20 1925  GLUCAP 86 90   Lipid Profile: No results for input(s): CHOL, HDL, LDLCALC, TRIG, CHOLHDL, LDLDIRECT in the last 72 hours. Thyroid Function Tests: No results for input(s): TSH, T4TOTAL, FREET4, T3FREE, THYROIDAB in the last 72 hours. Anemia Panel: Recent Labs    04/03/20 1349 04/04/20 0509  FERRITIN 1,480* 1,108*   Sepsis Labs: Recent Labs  Lab 04/03/20 1950  PROCALCITON <0.10  Recent Results (from the past 240 hour(s))  Respiratory Panel by RT PCR (Flu A&B, Covid) - Nasopharyngeal Swab     Status: Abnormal   Collection Time: 04/03/20 10:43 AM   Specimen: Nasopharyngeal Swab  Result Value Ref Range Status   SARS Coronavirus 2 by RT PCR POSITIVE (A) NEGATIVE Final    Comment: CRITICAL RESULT CALLED TO, READ BACK BY AND VERIFIED WITH: MACI BRAME,RN @1320  03/03/2020 KAY (NOTE) SARS-CoV-2 target nucleic acids are DETECTED.  SARS-CoV-2 RNA is generally detectable in upper respiratory specimens  during the acute phase of infection. Positive results are indicative of the presence of the identified virus, but do not rule out bacterial infection or co-infection with other pathogens not detected by the test. Clinical correlation with patient history and other diagnostic information is necessary to determine patient infection status. The expected result is Negative.  Fact Sheet for Patients:  05/03/2020  Fact Sheet for Healthcare Providers: https://www.moore.com/  This test is not yet approved or cleared by the https://www.young.biz/ FDA and  has been authorized for detection and/or diagnosis of SARS-CoV-2 by FDA under an Emergency Use Authorization (EUA).  This EUA will remain in  effect (meaning this tes t can be used) for the duration of  the COVID-19 declaration under Section 564(b)(1) of the Act, 21 U.S.C. section 360bbb-3(b)(1), unless the authorization is terminated or revoked sooner.      Influenza A by PCR NEGATIVE NEGATIVE Final   Influenza B by PCR NEGATIVE NEGATIVE Final    Comment: (NOTE) The Xpert Xpress SARS-CoV-2/FLU/RSV assay is intended as an aid in  the diagnosis of influenza from Nasopharyngeal swab specimens and  should not be used as a sole basis for treatment. Nasal washings and  aspirates are unacceptable for Xpert Xpress SARS-CoV-2/FLU/RSV  testing.  Fact Sheet for Patients: [place]  Fact Sheet for Healthcare Providers: https://www.moore.com/  This test is not yet approved or cleared by the https://www.young.biz/ FDA and  has been authorized for detection and/or diagnosis of SARS-CoV-2 by  FDA under an Emergency Use Authorization (EUA). This EUA will remain  in effect (meaning this test can be used) for the duration of the  Covid-19 declaration under Section 564(b)(1) of the Act, 21  U.S.C. section 360bbb-3(b)(1), unless the authorization is  terminated or revoked. Performed at [facility], [address], [place], [place]   MRSA PCR Screening     Status: None   Collection Time: 04/04/20  1:46 AM   Specimen: Nasal Mucosa; Nasopharyngeal  Result Value Ref Range Status   MRSA by PCR NEGATIVE NEGATIVE Final    Comment:        The GeneXpert MRSA Assay (FDA approved for NASAL specimens only), is one component of a comprehensive MRSA colonization surveillance program. It is not intended to diagnose MRSA infection nor to guide or monitor treatment for MRSA infections. Performed at [facility], [address], [place], [place]          Radiology Studies: [facility] Chest [facility] 1 View  Result Date: 04/03/2020 CLINICAL DATA:  Shortness of breath.  Recent COVID 19  diagnosis. EXAM: PORTABLE CHEST 1 VIEW COMPARISON:  CTA chest and chest x-ray dated December 09, 2018. FINDINGS: The heart size and mediastinal contours are within normal limits. Normal pulmonary vascularity. Mild patchy peripheral hazy interstitial and airspace opacities at the left greater than right lung bases. No pleural effusion or pneumothorax. No acute osseous abnormality. Spinal stimulator noted. IMPRESSION: 1. Mild patchy peripheral hazy interstitial and  airspace opacities at the left greater than right lung bases, consistent with multifocal COVID 19 pneumonia given clinical history. Electronically Signed   By: Obie DredgeWilliam T Derry M.D.   On: 04/03/2020 14:26        Scheduled Meds: . vitamin C  500 mg Oral Daily  . aspirin EC  81 mg Oral Daily  . Chlorhexidine Gluconate Cloth  6 each Topical Daily  . enoxaparin (LOVENOX) injection  40 mg Subcutaneous Q24H  . gabapentin  400 mg Oral TID  . hydrALAZINE  10 mg Oral Q8H  . [START ON 04/05/2020] lisinopril  10 mg Oral Daily  . loratadine  10 mg Oral QHS  . metoprolol tartrate  100 mg Oral BID  . mometasone-formoterol  2 puff Inhalation BID  . montelukast  10 mg Oral QHS  . tiZANidine  4 mg Oral Q6H  . zinc sulfate  220 mg Oral Daily   Continuous Infusions: . nitroGLYCERIN Stopped (04/04/20 0520)  . remdesivir 100 mg in NS 100 mL 100 mg (04/04/20 1012)     LOS: 1 day    Time spent: 35 minutes    Torrie Lafavor Hoover Brunette Griffin Gerrard, DO [facility]  If 7PM-7AM, please contact night-coverage [email or web address] 04/04/2020, 1:56 PM

## 2020-04-04 NOTE — ED Notes (Signed)
PT report called to [facility], verbalized complete understanding of Pt report and current plan of care, denies questions at this time. Pt resting on stretcher rails up x 2 call light in hand. Pt remains a/o x 4 skin pale diaphoretic at this time. Bilateral Ivs healthy with clean dry dressing. PT continues on Nitro drip and same given in report. Pt continues to complain of back pain 5/10 ache in nature. Pt is awaiting transport to clean ready room via stretcher following strict covid protocol with RN to transport.

## 2020-04-05 ENCOUNTER — Inpatient Hospital Stay (HOSPITAL_COMMUNITY): Payer: Medicare Other

## 2020-04-05 DIAGNOSIS — R9431 Abnormal electrocardiogram [ECG] [EKG]: Secondary | ICD-10-CM | POA: Diagnosis not present

## 2020-04-05 DIAGNOSIS — I340 Nonrheumatic mitral (valve) insufficiency: Secondary | ICD-10-CM | POA: Diagnosis not present

## 2020-04-05 LAB — CBC WITH DIFFERENTIAL/PLATELET
Abs Immature Granulocytes: 0.26 10*3/uL — ABNORMAL HIGH (ref 0.00–0.07)
Basophils Absolute: 0.1 10*3/uL (ref 0.0–0.1)
Basophils Relative: 0 %
Eosinophils Absolute: 0.1 10*3/uL (ref 0.0–0.5)
Eosinophils Relative: 0 %
HCT: 35.2 % — ABNORMAL LOW (ref 36.0–46.0)
Hemoglobin: 11.6 g/dL — ABNORMAL LOW (ref 12.0–15.0)
Immature Granulocytes: 2 %
Lymphocytes Relative: 21 %
Lymphs Abs: 2.5 10*3/uL (ref 0.7–4.0)
MCH: 31.3 pg (ref 26.0–34.0)
MCHC: 33 g/dL (ref 30.0–36.0)
MCV: 94.9 fL (ref 80.0–100.0)
Monocytes Absolute: 1.9 10*3/uL — ABNORMAL HIGH (ref 0.1–1.0)
Monocytes Relative: 16 %
Neutro Abs: 7.2 10*3/uL (ref 1.7–7.7)
Neutrophils Relative %: 61 %
Platelets: 393 10*3/uL (ref 150–400)
RBC: 3.71 MIL/uL — ABNORMAL LOW (ref 3.87–5.11)
RDW: 14.6 % (ref 11.5–15.5)
WBC: 12 10*3/uL — ABNORMAL HIGH (ref 4.0–10.5)
nRBC: 0 % (ref 0.0–0.2)

## 2020-04-05 LAB — COMPREHENSIVE METABOLIC PANEL
ALT: 21 U/L (ref 0–44)
AST: 34 U/L (ref 15–41)
Albumin: 3.1 g/dL — ABNORMAL LOW (ref 3.5–5.0)
Alkaline Phosphatase: 95 U/L (ref 38–126)
Anion gap: 11 (ref 5–15)
BUN: 24 mg/dL — ABNORMAL HIGH (ref 8–23)
CO2: 24 mmol/L (ref 22–32)
Calcium: 8.8 mg/dL — ABNORMAL LOW (ref 8.9–10.3)
Chloride: 105 mmol/L (ref 98–111)
Creatinine, Ser: 1.06 mg/dL — ABNORMAL HIGH (ref 0.44–1.00)
GFR, Estimated: 58 mL/min — ABNORMAL LOW (ref >60)
Glucose, Bld: 105 mg/dL — ABNORMAL HIGH (ref 70–99)
Potassium: 4.3 mmol/L (ref 3.5–5.1)
Sodium: 140 mmol/L (ref 135–145)
Total Bilirubin: 0.4 mg/dL (ref 0.3–1.2)
Total Protein: 6.7 g/dL (ref 6.5–8.1)

## 2020-04-05 LAB — ECHOCARDIOGRAM COMPLETE
Area-P 1/2: 2.26 cm2
Height: 63 in
S' Lateral: 2.71 cm
Weight: 2451.52 oz

## 2020-04-05 LAB — C-REACTIVE PROTEIN: CRP: 3.1 mg/dL — ABNORMAL HIGH (ref <1.0)

## 2020-04-05 LAB — D-DIMER, QUANTITATIVE: D-Dimer, Quant: 1.14 ug/mL-FEU — ABNORMAL HIGH (ref 0.00–0.50)

## 2020-04-05 LAB — FERRITIN: Ferritin: 992 ng/mL — ABNORMAL HIGH (ref 11–307)

## 2020-04-05 MED ORDER — LISINOPRIL 10 MG PO TABS
40.0000 mg | ORAL_TABLET | Freq: Every day | ORAL | Status: DC
  Administered 2020-04-05 – 2020-04-06 (×2): 40 mg via ORAL
  Filled 2020-04-05 (×2): qty 4

## 2020-04-05 MED ORDER — HYDRALAZINE HCL 25 MG PO TABS
25.0000 mg | ORAL_TABLET | Freq: Three times a day (TID) | ORAL | Status: DC
  Administered 2020-04-05 – 2020-04-06 (×3): 25 mg via ORAL
  Filled 2020-04-05 (×3): qty 1

## 2020-04-05 MED ORDER — LISINOPRIL 10 MG PO TABS: 20.0000 mg | ORAL_TABLET | Freq: Every day | ORAL | Status: DC

## 2020-04-05 NOTE — Progress Notes (Signed)
PROGRESS NOTE    Cynthia Yang  XTK:[id number] DOB: 1956/01/17 DOA: 04/03/2020 PCP: Catha Nottingham, FNP   Brief Narrative:  Per HPI: Cynthia Yang a 64 y.o.femalewith a history of GERD, hypertension, asthma. Patient presents with nausea and vomiting over the past 2 to 3 days. Patient was diagnosed with COVID on 10/26. She began having fevers and chills and nonproductive cough shortly thereafter which is continued. She does have some shortness of breath with dyspnea on exertion. Emesis is described as stomach contents and bile. Denies abdominal pain. Symptoms are worsening. She states that she cannot keep anything down and has lack of appetite.  The patient was going to get monoclonal antibodies this past Tuesday, although her husband passed away that day due to brain cancer,and she was unable to get the antibodies.  Emergency Department Course: Blood pressures have been extremely elevated: 216/72. EDP gave her 2 doses of hydralazine around 6:45 PM and 7:00. Chest x-ray shows multifocal pneumonia consistent with Covid pneumonia. White count 10. CMP relatively normal. Covid positive  11/7-11/8: Patient continues to remain on nitroglycerin drip for blood pressure management with significant elevation still noted. Her nausea and vomiting have improved and she is tolerating diet. Adjustments are being made to her blood pressure medications to help wean off nitroglycerin drip.  Assessment & Plan:   Principal Problem:   Intractable vomiting with nausea Active Problems:   Hypertensive urgency   GERD (gastroesophageal reflux disease)   Pneumonia due to COVID-19 virus   Asthma   COVID-19 pneumonia with associated intractable nausea and vomiting -Continue current treatments -Downward inflammatory trend noted, will avoid steroids for now as this could contribute to elevated blood pressure readings -Continue other measures as ordered with remdesivir  Uncontrolled  hypertension with hypertensive urgency -Nitroglycerin drip resumed -Increased dose of lisinopril 40 mg today and avoid amlodipine due to previous lower extremity edema -Continue oral metoprolol -Hydralazine increased to 25 mg 3 times daily -Patient cannot tolerate amlodipine due to lower extremity edema -IV pushes of labetalol as needed to help wean off nitroglycerin drip  History of asthma -Continue Spiriva and breathing treatments as needed  GERD -Continue PPI IV daily for now   DVT prophylaxis:Lovenox Code Status: Full Family Communication: Discussed with daughter on phone 11/8 Disposition Plan:  Status is: Inpatient  Remains inpatient appropriate because:IV treatments appropriate due to intensity of illness or inability to take PO and Inpatient level of care appropriate due to severity of illness   Dispo: The patient is from: Home  Anticipated d/c is to: Home  Anticipated d/c date is: 1-2 days  Patient currently is not medically stable to d/c. Continue on nitro drip for significantly elevated bp readings and wean as tolerated.   Consultants:   None  Procedures:   See below  Antimicrobials:  Anti-infectives (From admission, onward)   Start     Dose/Rate Route Frequency Ordered Stop   04/04/20 1000  remdesivir 100 mg in sodium chloride 0.9 % 100 mL IVPB  Status:  Discontinued       "Followed by" Linked Group Details   100 mg 200 mL/hr over 30 Minutes Intravenous Daily 04/03/20 2048 04/03/20 2055   04/04/20 1000  remdesivir 100 mg in sodium chloride 0.9 % 100 mL IVPB        100 mg 200 mL/hr over 30 Minutes Intravenous Daily 04/03/20 2056 04/08/20 0959   04/03/20 2100  remdesivir 100 mg in sodium chloride 0.9 % 100 mL IVPB  100 mg 200 mL/hr over 30 Minutes Intravenous Every 1 hr x 2 04/03/20 2055 04/03/20 2125   04/03/20 2048  remdesivir 200 mg in sodium chloride 0.9% 250 mL IVPB  Status:  Discontinued         "Followed by" Linked Group Details   200 mg 580 mL/hr over 30 Minutes Intravenous Once 04/03/20 2048 04/03/20 2055       Subjective: Patient seen and evaluated today with no new acute complaints or concerns. She continues to have significantly elevated blood pressure readings requiring nitroglycerin drip. She denies any significant nausea or vomiting and is tolerating small amounts of her diet.  Objective: Vitals:   04/05/20 0700 04/05/20 0715 04/05/20 0730 04/05/20 0745  BP: (!) 220/76 (!) 209/68 (!) 232/75 (!) 222/74  Pulse: 72 71 75 74  Resp: (!) 24 (!) 27 (!) 24 (!) 22  Temp:      TempSrc:      SpO2: 98% 95% 96% 98%  Weight:      Height:        Intake/Output Summary (Last 24 hours) at 04/05/2020 0852 Last data filed at 04/05/2020 0600 Gross per 24 hour  Intake 1345.4 ml  Output 850 ml  Net 495.4 ml   Filed Weights   04/04/20 0902  Weight: 69.5 kg    Examination:  General exam: Appears calm and comfortable  Respiratory system: Clear to auscultation. Respiratory effort normal. Cardiovascular system: S1 & S2 heard, RRR.  Gastrointestinal system: Abdomen is nondistended, soft and nontender.  Central nervous system: Alert and awake Extremities: No edema Skin: No rashes, lesions or ulcers Psychiatry: Judgement and insight appear normal. Mood & affect appropriate.     Data Reviewed: I have personally reviewed following labs and imaging studies  CBC: Recent Labs  Lab 04/03/20 1349 04/04/20 0455 04/05/20 0538  WBC 10.4 8.5 12.0*  NEUTROABS 7.2 6.0 7.2  HGB 14.0 10.5* 11.6*  HCT 42.4 31.7* 35.2*  MCV 93.4 94.1 94.9  PLT 340 343 393   Basic Metabolic Panel: Recent Labs  Lab 04/03/20 1349 04/04/20 0455 04/05/20 0538  NA 136 138 140  K 4.7 4.0 4.3  CL 98 102 105  CO2 26 22 24   GLUCOSE 85 98 105*  BUN 18 26* 24*  CREATININE 1.00 1.18* 1.06*  CALCIUM 9.7 8.4* 8.8*   GFR: Estimated Creatinine Clearance: 49.4 mL/min (A) (by C-G formula based on SCr of  1.06 mg/dL (H)). Liver Function Tests: Recent Labs  Lab 04/03/20 1349 04/04/20 0455 04/05/20 0538  AST 34 24 34  ALT 27 20 21   ALKPHOS 143* 103 95  BILITOT 0.6 0.7 0.4  PROT 8.4* 6.6 6.7  ALBUMIN 3.9 3.1* 3.1*   Recent Labs  Lab 04/03/20 1349  LIPASE 35   No results for input(s): AMMONIA in the last 168 hours. Coagulation Profile: No results for input(s): INR, PROTIME in the last 168 hours. Cardiac Enzymes: No results for input(s): CKTOTAL, CKMB, CKMBINDEX, TROPONINI in the last 168 hours. BNP (last 3 results) No results for input(s): PROBNP in the last 8760 hours. HbA1C: No results for input(s): HGBA1C in the last 72 hours. CBG: Recent Labs  Lab 04/03/20 1334 04/03/20 1925  GLUCAP 86 90   Lipid Profile: No results for input(s): CHOL, HDL, LDLCALC, TRIG, CHOLHDL, LDLDIRECT in the last 72 hours. Thyroid Function Tests: No results for input(s): TSH, T4TOTAL, FREET4, T3FREE, THYROIDAB in the last 72 hours. Anemia Panel: Recent Labs    04/04/20 0509 04/05/20 0538  FERRITIN 1,108*  992*   Sepsis Labs: Recent Labs  Lab 04/03/20 1950  PROCALCITON <0.10    Recent Results (from the past 240 hour(s))  Respiratory Panel by RT PCR (Flu A&B, Covid) - Nasopharyngeal Swab     Status: Abnormal   Collection Time: 04/03/20 10:43 AM   Specimen: Nasopharyngeal Swab  Result Value Ref Range Status   SARS Coronavirus 2 by RT PCR POSITIVE (A) NEGATIVE Final    Comment: CRITICAL RESULT CALLED TO, READ BACK BY AND VERIFIED WITH: MACI BRAME,RN @1320  03/03/2020 KAY (NOTE) SARS-CoV-2 target nucleic acids are DETECTED.  SARS-CoV-2 RNA is generally detectable in upper respiratory specimens  during the acute phase of infection. Positive results are indicative of the presence of the identified virus, but do not rule out bacterial infection or co-infection with other pathogens not detected by the test. Clinical correlation with patient history and other diagnostic information is  necessary to determine patient infection status. The expected result is Negative.  Fact Sheet for Patients:  05/03/2020  Fact Sheet for Healthcare Providers: https://www.moore.com/  This test is not yet approved or cleared by the https://www.young.biz/ FDA and  has been authorized for detection and/or diagnosis of SARS-CoV-2 by FDA under an Emergency Use Authorization (EUA).  This EUA will remain in effect (meaning this tes t can be used) for the duration of  the COVID-19 declaration under Section 564(b)(1) of the Act, 21 U.S.C. section 360bbb-3(b)(1), unless the authorization is terminated or revoked sooner.      Influenza A by PCR NEGATIVE NEGATIVE Final   Influenza B by PCR NEGATIVE NEGATIVE Final    Comment: (NOTE) The Xpert Xpress SARS-CoV-2/FLU/RSV assay is intended as an aid in  the diagnosis of influenza from Nasopharyngeal swab specimens and  should not be used as a sole basis for treatment. Nasal washings and  aspirates are unacceptable for Xpert Xpress SARS-CoV-2/FLU/RSV  testing.  Fact Sheet for Patients: [place]  Fact Sheet for Healthcare Providers: https://www.moore.com/  This test is not yet approved or cleared by the https://www.young.biz/ FDA and  has been authorized for detection and/or diagnosis of SARS-CoV-2 by  FDA under an Emergency Use Authorization (EUA). This EUA will remain  in effect (meaning this test can be used) for the duration of the  Covid-19 declaration under Section 564(b)(1) of the Act, 21  U.S.C. section 360bbb-3(b)(1), unless the authorization is  terminated or revoked. Performed at [facility], [address] [place], [place]   MRSA PCR Screening     Status: None   Collection Time: 04/04/20  1:46 AM   Specimen: Nasal Mucosa; Nasopharyngeal  Result Value Ref Range Status   MRSA by PCR NEGATIVE NEGATIVE Final    Comment:        The  GeneXpert MRSA Assay (FDA approved for NASAL specimens only), is one component of a comprehensive MRSA colonization surveillance program. It is not intended to diagnose MRSA infection nor to guide or monitor treatment for MRSA infections. Performed at [facility], [address], [place], [place]          Radiology Studies: [facility] Chest [facility] 1 View  Result Date: 04/03/2020 CLINICAL DATA:  Shortness of breath.  Recent COVID 19 diagnosis. EXAM: PORTABLE CHEST 1 VIEW COMPARISON:  CTA chest and chest x-ray dated December 09, 2018. FINDINGS: The heart size and mediastinal contours are within normal limits. Normal pulmonary vascularity. Mild patchy peripheral hazy interstitial and airspace opacities at the left greater than right lung bases. No pleural effusion  or pneumothorax. No acute osseous abnormality. Spinal stimulator noted. IMPRESSION: 1. Mild patchy peripheral hazy interstitial and airspace opacities at the left greater than right lung bases, consistent with multifocal COVID 19 pneumonia given clinical history. Electronically Signed   By: Obie DredgeWilliam T Derry M.D.   On: 04/03/2020 14:26        Scheduled Meds: . vitamin C  500 mg Oral Daily  . aspirin EC  81 mg Oral Daily  . Chlorhexidine Gluconate Cloth  6 each Topical Daily  . enoxaparin (LOVENOX) injection  40 mg Subcutaneous Q24H  . gabapentin  400 mg Oral TID  . hydrALAZINE  25 mg Oral Q8H  . lisinopril  40 mg Oral Daily  . loratadine  10 mg Oral QHS  . metoprolol tartrate  100 mg Oral BID  . mometasone-formoterol  2 puff Inhalation BID  . montelukast  10 mg Oral QHS  . pantoprazole (PROTONIX) IV  40 mg Intravenous Q24H  . tiZANidine  4 mg Oral Q6H  . zinc sulfate  220 mg Oral Daily   Continuous Infusions: . nitroGLYCERIN 5 mcg/min (04/05/20 0406)  . remdesivir 100 mg in NS 100 mL Stopped (04/04/20 1045)     LOS: 2 days    Time spent: 35 minutes    Soley Harriss D Sherryll BurgerShah, DO [facility]  If 7PM-7AM, please  contact night-coverage [email or web address] 04/05/2020, 8:52 AM

## 2020-04-05 NOTE — Progress Notes (Signed)
*  PRELIMINARY RESULTS* Echocardiogram 2D Echocardiogram has been performed.  Jeryl Columbia 04/05/2020, 11:33 AM

## 2020-04-05 NOTE — Progress Notes (Signed)
Nitro gtt stopped at 1015 due to resolving BP's. Both IV's saline locked. Will continue to monitor and hopeful patient can stay off the gtt rest of the day.

## 2020-04-05 NOTE — Progress Notes (Signed)
Pt blood pressure noted to be trending down significantly. Stopped nitro gtt. Will continue to monitor pt blood pressure.

## 2020-04-05 NOTE — TOC Initial Note (Addendum)
Transition of Care [facility]) - Initial/Assessment Note    Patient Details  Name: Cynthia Yang MRN: [id number] Date of Birth: [birth year]  [facility] CM/SW Contact:    Barry Brunner, LCSW Phone Number: 04/05/2020, 2:21 PM  Clinical Narrative:                 Patient is a 64 year old woman admitted for Covid 19 infection. CSW contacted patient's daughter for initial assessment. Patient's daughter reported that patient at baseline is able to perform all of her ADL's. Patient does not utilize DME equipment at home. Patient's daughter also reported that patient would be agreeable to [facility] and preferred to utilize [facility] if [facility] recommended. If recommended for SNF, patient's daughter reported that patient and family would have to further discuss option. CSW will follow up for PCP appointment upon d/c. TOC to follow.  Expected Discharge Plan: Home/Self Care Barriers to Discharge: Continued Medical Work up   Patient Goals and CMS Choice Patient states their goals for this hospitalization and ongoing recovery are:: Return home      Expected Discharge Plan and Services Expected Discharge Plan: Home/Self Care       Living arrangements for the past 2 months: Single Family Home                                      Prior Living Arrangements/Services Living arrangements for the past 2 months: Single Family Home Lives with:: Self Patient language and need for interpreter reviewed:: Yes Do you feel safe going back to the place where you live?: Yes      Need for Family Participation in Patient Care: Yes (Comment) Care giver support system in place?: Yes (comment)   Criminal Activity/Legal Involvement Pertinent to Current Situation/Hospitalization: No - Comment as needed  Activities of Daily Living Home Assistive Devices/Equipment: None ADL Screening (condition at time of admission) Patient's cognitive ability adequate to safely complete daily activities?: Yes Is the  patient deaf or have difficulty hearing?: No Does the patient have difficulty seeing, even when wearing glasses/contacts?: No Does the patient have difficulty concentrating, remembering, or making decisions?: No Patient able to express need for assistance with ADLs?: Yes Does the patient have difficulty dressing or bathing?: No Independently performs ADLs?: Yes (appropriate for developmental age) Does the patient have difficulty walking or climbing stairs?: No Weakness of Legs: None Weakness of Arms/Hands: None  Permission Sought/Granted   Permission granted to share information with : Yes, Verbal Permission Granted     Permission granted to share info w AGENCY: [facility]  Permission granted to share info w Relationship: Daughter  Permission granted to share info w Contact Information: [phone]  Emotional Assessment       Orientation: : Oriented to Self, Oriented to Place, Oriented to  Time, Oriented to Situation Alcohol / Substance Use: Not Applicable Psych Involvement: No (comment)  Admission diagnosis:  Nausea [R11.0] Hypertensive urgency [I16.0] Pneumonia due to COVID-19 virus [U07.1, J12.82] COVID-19 [U07.1] Patient Active Problem List   Diagnosis Date Noted  . Pneumonia due to COVID-19 virus 04/03/2020  . Asthma   . Acute respiratory failure with hypoxia (HCC) 12/11/2018  . Asthma exacerbation 12/09/2018  . Hypertension   . GERD (gastroesophageal reflux disease)   . Anxiety   . Hyperglycemia   . Intractable vomiting with nausea   . Hypertensive urgency   . AKI (acute kidney injury) (HCC)   .  Hypokalemia   . Opioid use with withdrawal (HCC)   . Hypertensive emergency 01/19/2017  . Acute nonintractable headache   . Opioid withdrawal (HCC)    PCP:  Catha Nottingham, FNP Pharmacy:   [facility] [phone] - [place], [place] - [address] AT [facility] [address] [place] [address] [place] [address] Phone: [phone] Fax:  [phone]     Social Determinants of Health (SDOH) Interventions    Readmission Risk Interventions Readmission Risk Prevention Plan 04/05/2020 12/12/2018  Transportation Screening Complete Complete  PCP or Specialist Appt within 3-5 Days - Complete  HRI or Home Care Consult Complete Not Complete  HRI or Home Care Consult comments - not recommended at this time.  Social Work Consult for Recovery Care Planning/Counseling Complete Complete  Palliative Care Screening Not Applicable Not Applicable  Medication Review Oceanographer) Complete Complete  Some recent data might be hidden

## 2020-04-05 NOTE — Progress Notes (Signed)
Pt blood pressure over 200 systolic. Restarted nitro drip.

## 2020-04-06 LAB — COMPREHENSIVE METABOLIC PANEL
ALT: 15 U/L (ref 0–44)
AST: 23 U/L (ref 15–41)
Albumin: 2.7 g/dL — ABNORMAL LOW (ref 3.5–5.0)
Alkaline Phosphatase: 81 U/L (ref 38–126)
Anion gap: 11 (ref 5–15)
BUN: 23 mg/dL (ref 8–23)
CO2: 25 mmol/L (ref 22–32)
Calcium: 8.3 mg/dL — ABNORMAL LOW (ref 8.9–10.3)
Chloride: 103 mmol/L (ref 98–111)
Creatinine, Ser: 0.81 mg/dL (ref 0.44–1.00)
GFR, Estimated: >60 mL/min (ref >60)
Glucose, Bld: 116 mg/dL — ABNORMAL HIGH (ref 70–99)
Potassium: 4.4 mmol/L (ref 3.5–5.1)
Sodium: 139 mmol/L (ref 135–145)
Total Bilirubin: 0.5 mg/dL (ref 0.3–1.2)
Total Protein: 6 g/dL — ABNORMAL LOW (ref 6.5–8.1)

## 2020-04-06 LAB — CBC WITH DIFFERENTIAL/PLATELET
Abs Immature Granulocytes: 0.19 10*3/uL — ABNORMAL HIGH (ref 0.00–0.07)
Basophils Absolute: 0 10*3/uL (ref 0.0–0.1)
Basophils Relative: 0 %
Eosinophils Absolute: 0.1 10*3/uL (ref 0.0–0.5)
Eosinophils Relative: 1 %
HCT: 31.8 % — ABNORMAL LOW (ref 36.0–46.0)
Hemoglobin: 10.2 g/dL — ABNORMAL LOW (ref 12.0–15.0)
Immature Granulocytes: 2 %
Lymphocytes Relative: 17 %
Lymphs Abs: 1.8 10*3/uL (ref 0.7–4.0)
MCH: 31 pg (ref 26.0–34.0)
MCHC: 32.1 g/dL (ref 30.0–36.0)
MCV: 96.7 fL (ref 80.0–100.0)
Monocytes Absolute: 1.7 10*3/uL — ABNORMAL HIGH (ref 0.1–1.0)
Monocytes Relative: 16 %
Neutro Abs: 6.6 10*3/uL (ref 1.7–7.7)
Neutrophils Relative %: 64 %
Platelets: 359 10*3/uL (ref 150–400)
RBC: 3.29 MIL/uL — ABNORMAL LOW (ref 3.87–5.11)
RDW: 14.5 % (ref 11.5–15.5)
WBC: 10.5 10*3/uL (ref 4.0–10.5)
nRBC: 0 % (ref 0.0–0.2)

## 2020-04-06 LAB — D-DIMER, QUANTITATIVE: D-Dimer, Quant: 0.5 ug/mL-FEU (ref 0.00–0.50)

## 2020-04-06 LAB — C-REACTIVE PROTEIN: CRP: 3.1 mg/dL — ABNORMAL HIGH (ref <1.0)

## 2020-04-06 LAB — FERRITIN: Ferritin: 792 ng/mL — ABNORMAL HIGH (ref 11–307)

## 2020-04-06 MED ORDER — ASCORBIC ACID 500 MG PO TABS: 500.0000 mg | ORAL_TABLET | Freq: Every day | ORAL | 0 refills | Status: AC

## 2020-04-06 MED ORDER — HYDRALAZINE HCL 25 MG PO TABS: 25.0000 mg | ORAL_TABLET | Freq: Three times a day (TID) | ORAL | 2 refills | Status: AC

## 2020-04-06 MED ORDER — LISINOPRIL 40 MG PO TABS: 40.0000 mg | ORAL_TABLET | Freq: Every day | ORAL | 3 refills | Status: AC

## 2020-04-06 MED ORDER — GUAIFENESIN-DM 100-10 MG/5ML PO SYRP: 10.0000 mL | ORAL_SOLUTION | ORAL | 0 refills | Status: AC | PRN

## 2020-04-06 MED ORDER — ZINC SULFATE 220 (50 ZN) MG PO CAPS: 220.0000 mg | ORAL_CAPSULE | Freq: Every day | ORAL | 0 refills | Status: AC

## 2020-04-06 NOTE — Discharge Instructions (Signed)
Patient scheduled for outpatient Remdesivir infusion at 12pm on Wednesday 11/10 at [facility]. Please inform the patient to park at [address], [place], as staff will be escorting the patient through the east entrance of the hospital. Appointments take approximately 45 minutes.    There is a wave flag banner located near the entrance on [address] Turn into this entrance and immediately turn left or right and park in 1 of the 10 designated Covid Infusion Parking spots. There is a phone number on the sign, please call and let the staff know what spot you are in and we will come out and get you. For questions call [phone].  Thanks.   

## 2020-04-06 NOTE — Progress Notes (Signed)
Patient scheduled for outpatient Remdesivir infusion at 12pm on Wednesday 11/10 at [facility]. Please inform the patient to park at [address] [place], [place], as staff will be escorting the patient through the east entrance of the hospital. Appointments take approximately 45 minutes.    There is a wave flag banner located near the entrance on [address]. Turn into this entrance and immediately turn left or right and park in 1 of the 10 designated Covid Infusion Parking spots. There is a phone number on the sign, please call and let the staff know what spot you are in and we will come out and get you. For questions call [phone].  Thanks.

## 2020-04-06 NOTE — Discharge Summary (Signed)
Physician Discharge Summary  Cynthia Yang CVE:[id number] DOB: 03-01-1956 DOA: 04/03/2020  PCP: Catha Nottingham, FNP  Admit date: 04/03/2020  Discharge date: 04/06/2020  Admitted From:Home  Disposition:  Home  Recommendations for Outpatient Follow-up:  1. Follow up with PCP in 1-2 weeks 2. Follow-up blood pressure in outpatient setting and adjust medications as needed 3. Remain on home metoprolol with increase in lisinopril to 40 mg daily as well as hydralazine 25 mg 3 times daily added for better blood pressure control 4. Finish remdesivir infusion for 1 more day to complete 5-day course of treatment and use inhalers and antitussives as needed for symptomatic management  Home Health: None  Equipment/Devices: None  Discharge Condition: Stable  CODE STATUS: Full  Diet recommendation: Heart Healthy  Brief/Interim Summary: Per HPI: Cynthia Yang a 64 y.o.femalewith a history of GERD, hypertension, asthma. Patient presents with nausea and vomiting over the past 2 to 3 days. Patient was diagnosed with COVID on 10/26. She began having fevers and chills and nonproductive cough shortly thereafter which is continued. She does have some shortness of breath with dyspnea on exertion. Emesis is described as stomach contents and bile. Denies abdominal pain. Symptoms are worsening. She states that she cannot keep anything down and has lack of appetite.  The patient was going to get monoclonal antibodies this past Tuesday, although her husband passed away that day due to brain cancer,and she was unable to get the antibodies.  Emergency Department Course: Blood pressures have been extremely elevated: 216/72. EDP gave her 2 doses of hydralazine around 6:45 PM and 7:00. Chest x-ray shows multifocal pneumonia consistent with Covid pneumonia. White count 10. CMP relatively normal. Covid positive  -Patient was admitted with intractable nausea and vomiting likely related to  COVID-19 pneumonia which has resolved over the course of treatment with remdesivir and steroids.  She is tolerating diet over the last 48 hours with no further nausea or vomiting.  She was also noted to have hypertensive urgency and is noted to have very uncontrolled blood pressure readings at home.  She was started on nitroglycerin drip which was weaned off, but had to be resumed again despite increase in medications.  She now remains off nitroglycerin with stable blood pressure control on oral agents as prescribed.  She has no further symptomatology and is in stable condition for discharge and understands that she will require 1 more day of remdesivir infusion to complete her course of treatment and remain on blood pressure medications as prescribed.   Discharge Diagnoses:  Principal Problem:   Intractable vomiting with nausea Active Problems:   Hypertensive urgency   GERD (gastroesophageal reflux disease)   Pneumonia due to COVID-19 virus   Asthma  Principal discharge diagnosis: COVID-19 pneumonia with associated intractable nausea and vomiting in the setting of hypertensive urgency.  Discharge Instructions  Discharge Instructions    Diet - low sodium heart healthy   Complete by: As directed    Increase activity slowly   Complete by: As directed      Allergies as of 04/06/2020      Reactions   Other Anaphylaxis   Morphine And Related Other (See Comments)   "Shakes real bad"   Trazodone And Nefazodone    Fast heart rate   Vistaril [hydroxyzine Hcl] Other (See Comments)   Stops breathing   Duloxetine Hcl Palpitations   Trazodone Palpitations      Medication List    STOP taking these medications   amLODipine 5 MG tablet  Commonly known as: NORVASC   azithromycin 250 MG tablet Commonly known as: ZITHROMAX   clonazePAM 0.5 MG tablet Commonly known as: KLONOPIN   predniSONE 10 MG tablet Commonly known as: DELTASONE     TAKE these medications   albuterol 108 (90 Base)  MCG/ACT inhaler Commonly known as: VENTOLIN HFA Inhale 2 puffs into the lungs every 4 (four) hours as needed for wheezing or shortness of breath. What changed: Another medication with the same name was removed. Continue taking this medication, and follow the directions you see here.   amitriptyline 75 MG tablet Commonly known as: ELAVIL Take 75 mg by mouth at bedtime.   ascorbic acid 500 MG tablet Commonly known as: VITAMIN C Take 1 tablet (500 mg total) by mouth daily.   fluticasone 50 MCG/ACT nasal spray Commonly known as: FLONASE Place 1 spray into both nostrils daily.   gabapentin 400 MG capsule Commonly known as: NEURONTIN Take 400 mg by mouth 3 (three) times daily.   guaiFENesin-dextromethorphan 100-10 MG/5ML syrup Commonly known as: ROBITUSSIN DM Take 10 mLs by mouth every 4 (four) hours as needed for cough.   hydrALAZINE 25 MG tablet Commonly known as: APRESOLINE Take 1 tablet (25 mg total) by mouth every 8 (eight) hours.   HYDROcodone-homatropine 5-1.5 MG/5ML syrup Commonly known as: HYCODAN Take 5 mLs by mouth every 4 (four) hours as needed for cough.   levocetirizine 5 MG tablet Commonly known as: XYZAL Take 1 tablet by mouth at bedtime.   lisinopril 40 MG tablet Commonly known as: ZESTRIL Take 1 tablet (40 mg total) by mouth daily. What changed:   medication strength  how much to take   metoprolol tartrate 50 MG tablet Commonly known as: LOPRESSOR Take 1 tablet (50 mg total) by mouth 2 (two) times daily. What changed: how much to take   montelukast 10 MG tablet Commonly known as: SINGULAIR Take 1 tablet (10 mg total) by mouth at bedtime.   Symbicort 160-4.5 MCG/ACT inhaler Generic drug: budesonide-formoterol Inhale 2 puffs into the lungs 2 (two) times daily as needed for wheezing.   BUDESONIDE-FORMOTEROL FUMARATE IN Pharmacy records had no original sig   Zanaflex 4 MG capsule Generic drug: tiZANidine Take 1 tablet by mouth every 6 (six)  hours.   zinc sulfate 220 (50 Zn) MG capsule Take 1 capsule (220 mg total) by mouth daily.       Follow-up Information    Catha Nottingham, FNP. Schedule an appointment as soon as possible for a visit in 1 week(s).   Specialty: Family Medicine Contact information: [address] [place] [address] [phone]              Allergies  Allergen Reactions  . Other Anaphylaxis  . Morphine And Related Other (See Comments)    "Shakes real bad"  . Trazodone And Nefazodone     Fast heart rate  . Vistaril [Hydroxyzine Hcl] Other (See Comments)    Stops breathing  . Duloxetine Hcl Palpitations  . Trazodone Palpitations    Consultations:  None   Procedures/Studies: DG Chest Port 1 View  Result Date: 04/03/2020 CLINICAL DATA:  Shortness of breath.  Recent COVID 19 diagnosis. EXAM: PORTABLE CHEST 1 VIEW COMPARISON:  CTA chest and chest x-ray dated December 09, 2018. FINDINGS: The heart size and mediastinal contours are within normal limits. Normal pulmonary vascularity. Mild patchy peripheral hazy interstitial and airspace opacities at the left greater than right lung bases. No pleural effusion or pneumothorax. No acute osseous abnormality.  Spinal stimulator noted. IMPRESSION: 1. Mild patchy peripheral hazy interstitial and airspace opacities at the left greater than right lung bases, consistent with multifocal COVID 19 pneumonia given clinical history. Electronically Signed   By: Obie Dredge M.D.   On: 04/03/2020 14:26   ECHOCARDIOGRAM COMPLETE  Result Date: 04/05/2020    ECHOCARDIOGRAM REPORT   Patient Name:   Cynthia Yang Date of Exam: 04/05/2020 Medical Rec #:  [id number]         Height:       63.0 in Accession #:    [id number]        Weight:       153.2 lb Date of Birth:  1955/12/27         BSA:          1.727 m Patient Age:    64 years          BP:           127/47 mmHg Patient Gender: F                 HR:           76 bpm. [facility] Procedure: 2D Echo  Indications:    Abnormal ECG 794.31 / R94.31  History:        Patient has no prior history of Echocardiogram examinations.                 Risk Factors:Hypertension and Non-Smoker. Opioid Use withdrawl,                 Pneumonia due to Covid, GERD.  Sonographer:    Jeryl Columbia RDCS (AE) Referring Phys: [id number] Lamont Dowdy [facility] IMPRESSIONS  1. Left ventricular ejection fraction, by estimation, is 65 to 70%. The left ventricle has normal function. The left ventricle has no regional wall motion abnormalities. There is mild left ventricular hypertrophy. Left ventricular diastolic parameters are indeterminate.  2. Right ventricular systolic function is normal. The right ventricular size is normal. Tricuspid regurgitation signal is inadequate for assessing PA pressure.  3. The mitral valve is grossly normal. Mild mitral valve regurgitation.  4. The aortic valve is tricuspid. Aortic valve regurgitation is not visualized.  5. The inferior vena cava is normal in size with greater than 50% respiratory variability, suggesting right atrial pressure of 3 mmHg. FINDINGS  Left Ventricle: Left ventricular ejection fraction, by estimation, is 65 to 70%. The left ventricle has normal function. The left ventricle has no regional wall motion abnormalities. The left ventricular internal cavity size was normal in size. There is  mild left ventricular hypertrophy. Left ventricular diastolic parameters are indeterminate. Right Ventricle: The right ventricular size is normal. No increase in right ventricular wall thickness. Right ventricular systolic function is normal. Tricuspid regurgitation signal is inadequate for assessing PA pressure. Left Atrium: Left atrial size was normal in size. Right Atrium: Right atrial size was normal in size. Pericardium: There is no evidence of pericardial effusion. Mitral Valve: The mitral valve is grossly normal. Mild mitral valve regurgitation. Tricuspid Valve: The tricuspid valve is grossly normal.  Tricuspid valve regurgitation is trivial. Aortic Valve: The aortic valve is tricuspid. There is mild aortic valve annular calcification. Aortic valve regurgitation is not visualized. Pulmonic Valve: The pulmonic valve was grossly normal. Pulmonic valve regurgitation is trivial. Aorta: The aortic root is normal in size and structure. Venous: The inferior vena cava is normal in size with greater than 50% respiratory variability, suggesting right atrial  pressure of 3 mmHg. IAS/Shunts: No atrial level shunt detected by color flow Doppler.  LEFT VENTRICLE PLAX 2D LVIDd:         3.99 cm  Diastology LVIDs:         2.71 cm  LV e' medial:    5.00 cm/s LV PW:         1.37 cm  LV E/e' medial:  17.3 LV IVS:        1.06 cm  LV e' lateral:   5.66 cm/s LVOT diam:     1.90 cm  LV E/e' lateral: 15.3 LVOT Area:     2.84 cm  RIGHT VENTRICLE RV S prime:     13.60 cm/s TAPSE (M-mode): 2.2 cm LEFT ATRIUM             Index       RIGHT ATRIUM           Index LA diam:        3.50 cm 2.03 cm/m  RA Area:     10.00 cm LA Vol (A2C):   45.4 ml 26.29 ml/m RA Volume:   21.50 ml  12.45 ml/m LA Vol (A4C):   45.2 ml 26.18 ml/m LA Biplane Vol: 48.0 ml 27.80 ml/m   AORTA Ao Root diam: 2.30 cm MITRAL VALVE MV Area (PHT): 2.26 cm    SHUNTS MV Decel Time: 335 msec    Systemic Diam: 1.90 cm MV E velocity: 86.50 cm/s MV A velocity: 99.00 cm/s MV E/A ratio:  0.87 Nona Dell MD Electronically signed by Nona Dell MD Signature Date/Time: 04/05/2020/12:36:46 PM    Final      Discharge Exam: Vitals:   04/06/20 0700 04/06/20 0800  BP: (!) 133/51 (!) 164/61  Pulse: 67 65  Resp: (!) 28 (!) 25  Temp:    SpO2: 94% 94%   Vitals:   04/06/20 0600 04/06/20 0630 04/06/20 0700 04/06/20 0800  BP: (!) 155/49 (!) 164/55 (!) 133/51 (!) 164/61  Pulse: 62 67 67 65  Resp: (!) 27 20 (!) 28 (!) 25  Temp:      TempSrc:      SpO2: 92% 94% 94% 94%  Weight:      Height:        General: Pt is alert, awake, not in acute distress Cardiovascular:  RRR, S1/S2 +, no rubs, no gallops Respiratory: CTA bilaterally, no wheezing, no rhonchi Abdominal: Soft, NT, ND, bowel sounds + Extremities: no edema, no cyanosis    The results of significant diagnostics from this hospitalization (including imaging, microbiology, ancillary and laboratory) are listed below for reference.     Microbiology: Recent Results (from the past 240 hour(s))  Respiratory Panel by RT PCR (Flu A&B, Covid) - Nasopharyngeal Swab     Status: Abnormal   Collection Time: 04/03/20 10:43 AM   Specimen: Nasopharyngeal Swab  Result Value Ref Range Status   SARS Coronavirus 2 by RT PCR POSITIVE (A) NEGATIVE Final    Comment: CRITICAL RESULT CALLED TO, READ BACK BY AND VERIFIED WITH: MACI BRAME,RN  03/03/2020 KAY (NOTE) SARS-CoV-2 target nucleic acids are DETECTED.  SARS-CoV-2 RNA is generally detectable in upper respiratory specimens  during the acute phase of infection. Positive results are indicative of the presence of the identified virus, but do not rule out bacterial infection or co-infection with other pathogens not detected by the test. Clinical correlation with patient history and other diagnostic information is necessary to determine patient infection status. The expected result is Negative.  Fact Sheet for Patients:  https://www.moore.com/  Fact Sheet for Healthcare Providers: https://www.young.biz/  This test is not yet approved or cleared by the [place] FDA and  has been authorized for detection and/or diagnosis of SARS-CoV-2 by FDA under an Emergency Use Authorization (EUA).  This EUA will remain in effect (meaning this tes t can be used) for the duration of  the COVID-19 declaration under Section 564(b)(1) of the Act, 21 U.S.C. section 360bbb-3(b)(1), unless the authorization is terminated or revoked sooner.      Influenza A by PCR NEGATIVE NEGATIVE Final   Influenza B by PCR NEGATIVE NEGATIVE  Final    Comment: (NOTE) The Xpert Xpress SARS-CoV-2/FLU/RSV assay is intended as an aid in  the diagnosis of influenza from Nasopharyngeal swab specimens and  should not be used as a sole basis for treatment. Nasal washings and  aspirates are unacceptable for Xpert Xpress SARS-CoV-2/FLU/RSV  testing.  Fact Sheet for Patients: https://www.moore.com/  Fact Sheet for Healthcare Providers: https://www.young.biz/  This test is not yet approved or cleared by the [place] FDA and  has been authorized for detection and/or diagnosis of SARS-CoV-2 by  FDA under an Emergency Use Authorization (EUA). This EUA will remain  in effect (meaning this test can be used) for the duration of the  Covid-19 declaration under Section 564(b)(1) of the Act, 21  U.S.C. section 360bbb-3(b)(1), unless the authorization is  terminated or revoked. Performed at [facility], [address] [place], [place] [address]   MRSA PCR Screening     Status: None   Collection Time: 04/04/20  1:46 AM   Specimen: Nasal Mucosa; Nasopharyngeal  Result Value Ref Range Status   MRSA by PCR NEGATIVE NEGATIVE Final    Comment:        The GeneXpert MRSA Assay (FDA approved for NASAL specimens only), is one component of a comprehensive MRSA colonization surveillance program. It is not intended to diagnose MRSA infection nor to guide or monitor treatment for MRSA infections. Performed at [facility], [address], [place], [place] [address]      Labs: BNP (last 3 results) No results for input(s): BNP in the last 8760 hours. Basic Metabolic Panel: Recent Labs  Lab 04/03/20 1349 04/04/20 0455 04/05/20 0538 04/06/20 0434  NA 136 138 140 139  K 4.7 4.0 4.3 4.4  CL 98 102 105 103  CO2 26 22 24 25   GLUCOSE 85 98 105* 116*  BUN 18 26* 24* 23  CREATININE 1.00 1.18* 1.06* 0.81  CALCIUM 9.7 8.4* 8.8* 8.3*   Liver Function Tests: Recent Labs  Lab 04/03/20 1349  04/04/20 0455 04/05/20 0538 04/06/20 0434  AST 34 24 34 23  ALT 27 20 21 15   ALKPHOS 143* 103 95 81  BILITOT 0.6 0.7 0.4 0.5  PROT 8.4* 6.6 6.7 6.0*  ALBUMIN 3.9 3.1* 3.1* 2.7*   Recent Labs  Lab 04/03/20 1349  LIPASE 35   No results for input(s): AMMONIA in the last 168 hours. CBC: Recent Labs  Lab 04/03/20 1349 04/04/20 0455 04/05/20 0538 04/06/20 0434  WBC 10.4 8.5 12.0* 10.5  NEUTROABS 7.2 6.0 7.2 6.6  HGB 14.0 10.5* 11.6* 10.2*  HCT 42.4 31.7* 35.2* 31.8*  MCV 93.4 94.1 94.9 96.7  PLT 340 343 393 359   Cardiac Enzymes: No results for input(s): CKTOTAL, CKMB, CKMBINDEX, TROPONINI in the last 168 hours. BNP: Invalid input(s): POCBNP CBG: Recent Labs  Lab 04/03/20 1334 04/03/20 1925  GLUCAP 86 90   D-Dimer  Recent Labs    04/05/20 0538 04/06/20 0434  DDIMER 1.14* 0.50   Hgb A1c No results for input(s): HGBA1C in the last 72 hours. Lipid Profile No results for input(s): CHOL, HDL, LDLCALC, TRIG, CHOLHDL, LDLDIRECT in the last 72 hours. Thyroid function studies No results for input(s): TSH, T4TOTAL, T3FREE, THYROIDAB in the last 72 hours.  Invalid input(s): FREET3 Anemia work up Recent Labs    04/05/20 0538 04/06/20 0434  FERRITIN 992* 792*   Urinalysis    Component Value Date/Time   COLORURINE YELLOW 04/04/2020 0049   APPEARANCEUR HAZY (A) 04/04/2020 0049   LABSPEC 1.028 04/04/2020 0049   PHURINE 5.0 04/04/2020 0049   GLUCOSEU NEGATIVE 04/04/2020 0049   HGBUR NEGATIVE 04/04/2020 0049   BILIRUBINUR NEGATIVE 04/04/2020 0049   KETONESUR 5 (A) 04/04/2020 0049   PROTEINUR 100 (A) 04/04/2020 0049   NITRITE NEGATIVE 04/04/2020 0049   LEUKOCYTESUR SMALL (A) 04/04/2020 0049   Sepsis Labs Invalid input(s): PROCALCITONIN,  WBC,  LACTICIDVEN Microbiology Recent Results (from the past 240 hour(s))  Respiratory Panel by RT PCR (Flu A&B, Covid) - Nasopharyngeal Swab     Status: Abnormal   Collection Time: 04/03/20 10:43 AM   Specimen:  Nasopharyngeal Swab  Result Value Ref Range Status   SARS Coronavirus 2 by RT PCR POSITIVE (A) NEGATIVE Final    Comment: CRITICAL RESULT CALLED TO, READ BACK BY AND VERIFIED WITH: MACI BRAME,RN @1320  03/03/2020 KAY (NOTE) SARS-CoV-2 target nucleic acids are DETECTED.  SARS-CoV-2 RNA is generally detectable in upper respiratory specimens  during the acute phase of infection. Positive results are indicative of the presence of the identified virus, but do not rule out bacterial infection or co-infection with other pathogens not detected by the test. Clinical correlation with patient history and other diagnostic information is necessary to determine patient infection status. The expected result is Negative.  Fact Sheet for Patients:  05/03/2020  Fact Sheet for Healthcare Providers: https://www.moore.com/  This test is not yet approved or cleared by the https://www.young.biz/ FDA and  has been authorized for detection and/or diagnosis of SARS-CoV-2 by FDA under an Emergency Use Authorization (EUA).  This EUA will remain in effect (meaning this tes t can be used) for the duration of  the COVID-19 declaration under Section 564(b)(1) of the Act, 21 U.S.C. section 360bbb-3(b)(1), unless the authorization is terminated or revoked sooner.      Influenza A by PCR NEGATIVE NEGATIVE Final   Influenza B by PCR NEGATIVE NEGATIVE Final    Comment: (NOTE) The Xpert Xpress SARS-CoV-2/FLU/RSV assay is intended as an aid in  the diagnosis of influenza from Nasopharyngeal swab specimens and  should not be used as a sole basis for treatment. Nasal washings and  aspirates are unacceptable for Xpert Xpress SARS-CoV-2/FLU/RSV  testing.  Fact Sheet for Patients: [place]  Fact Sheet for Healthcare Providers: https://www.moore.com/  This test is not yet approved or cleared by the https://www.young.biz/ FDA and   has been authorized for detection and/or diagnosis of SARS-CoV-2 by  FDA under an Emergency Use Authorization (EUA). This EUA will remain  in effect (meaning this test can be used) for the duration of the  Covid-19 declaration under Section 564(b)(1) of the Act, 21  U.S.C. section 360bbb-3(b)(1), unless the authorization is  terminated or revoked. Performed at [facility], [address] [place], [place]   MRSA PCR Screening     Status: None   Collection Time: 04/04/20  1:46 AM   Specimen: Nasal Mucosa;  Nasopharyngeal  Result Value Ref Range Status   MRSA by PCR NEGATIVE NEGATIVE Final    Comment:        The GeneXpert MRSA Assay (FDA approved for NASAL specimens only), is one component of a comprehensive MRSA colonization surveillance program. It is not intended to diagnose MRSA infection nor to guide or monitor treatment for MRSA infections. Performed at [facility], [address] [place], [place] [id number]      Time coordinating discharge: 35 minutes  SIGNED:   Erick BlinksPratik D Terrelle Ruffolo, DO [facility] 04/06/2020, 8:25 AM  If 7PM-7AM, please contact night-coverage [email or web address]

## 2020-04-07 ENCOUNTER — Ambulatory Visit (HOSPITAL_COMMUNITY)
Admit: 2020-04-07 | Discharge: 2020-04-07 | Disposition: A | Discharge status: Home / Self Care | Payer: Medicare Other | Attending: Pulmonary Disease | Admitting: Pulmonary Disease

## 2020-04-07 DIAGNOSIS — U071 COVID-19: Secondary | ICD-10-CM | POA: Diagnosis present

## 2020-04-07 MED ORDER — METHYLPREDNISOLONE SODIUM SUCC 125 MG IJ SOLR: 125.0000 mg | Freq: Once | INTRAMUSCULAR | Status: DC | PRN

## 2020-04-07 MED ORDER — SODIUM CHLORIDE 0.9 % IV SOLN
100.0000 mg | Freq: Once | INTRAVENOUS | Status: AC
  Administered 2020-04-07: 100 mg via INTRAVENOUS
  Filled 2020-04-07: qty 20

## 2020-04-07 MED ORDER — FAMOTIDINE IN NACL 20-0.9 MG/50ML-% IV SOLN: 20.0000 mg | Freq: Once | INTRAVENOUS | Status: DC | PRN

## 2020-04-07 MED ORDER — DIPHENHYDRAMINE HCL 50 MG/ML IJ SOLN: 50.0000 mg | Freq: Once | INTRAMUSCULAR | Status: DC | PRN

## 2020-04-07 MED ORDER — ALBUTEROL SULFATE HFA 108 (90 BASE) MCG/ACT IN AERS: 2.0000 | INHALATION_SPRAY | Freq: Once | RESPIRATORY_TRACT | Status: DC | PRN

## 2020-04-07 MED ORDER — SODIUM CHLORIDE 0.9 % IV SOLN: INTRAVENOUS | Status: DC | PRN

## 2020-04-07 MED ORDER — EPINEPHRINE 0.3 MG/0.3ML IJ SOAJ: 0.3000 mg | Freq: Once | INTRAMUSCULAR | Status: DC | PRN

## 2020-04-07 NOTE — Progress Notes (Signed)
  Diagnosis: COVID-19  Physician: Dr. Wright  Procedure: [facility] Infusion [facility] Med: remdesivir infusion - Provided patient with remdesivir fact sheet for patients, parents and caregivers prior to infusion.  Complications: No immediate complications noted.  Discharge: Discharged home   Cynthia Yang 04/07/2020   

## 2020-04-07 NOTE — Discharge Instructions (Signed)
10 Things You Can Do to Manage Your COVID-19 Symptoms at Home If you have possible or confirmed COVID-19: 1. Stay home from work and school. And stay away from other public places. If you must go out, avoid using any kind of public transportation, ridesharing, or taxis. 2. Monitor your symptoms carefully. If your symptoms get worse, call your healthcare provider immediately. 3. Get rest and stay hydrated. 4. If you have a medical appointment, call the healthcare provider ahead of time and tell them that you have or may have COVID-19. 5. For medical emergencies, call 911 and notify the dispatch personnel that you have or may have COVID-19. 6. Cover your cough and sneezes with a tissue or use the inside of your elbow. 7. Wash your hands often with soap and water for at least 20 seconds or clean your hands with an alcohol-based hand sanitizer that contains at least 60% alcohol. 8. As much as possible, stay in a specific room and away from other people in your home. Also, you should use a separate bathroom, if available. If you need to be around other people in or outside of the home, wear a mask. 9. Avoid sharing personal items with other people in your household, like dishes, towels, and bedding. 10. Clean all surfaces that are touched often, like counters, tabletops, and doorknobs. Use household cleaning sprays or wipes according to the label instructions. cdc.gov/coronavirus 11/27/2018 This information is not intended to replace advice given to you by your health care provider. Make sure you discuss any questions you have with your health care provider. Document Revised: 05/01/2019 Document Reviewed: 05/01/2019 Elsevier Patient Education  2020 [employer].  

## 2020-10-11 IMAGING — CT CT ANGIOGRAPHY CHEST
2 of 6 series · 19 of 46 positions shown · IV contrast (Isovue)
Comparison: None.

CLINICAL DATA: Increased shortness of breath since [REDACTED], mid
chest tightness today.

EXAM:
CT ANGIOGRAPHY CHEST WITH CONTRAST
TECHNIQUE: Multidetector CT imaging of the chest was performed using the
standard protocol during bolus administration of intravenous
contrast. Multiplanar CT image reconstructions and MIPs were
obtained to evaluate the vascular anatomy.
CONTRAST:  100mL OMNIPAQUE IOHEXOL 350 MG/ML SOLN

[Series 6: 3 thins · axial · 0.69mm/px · z∈[-331,-79]mm · 16 of 278 slices shown]
[im 13/278  lung]
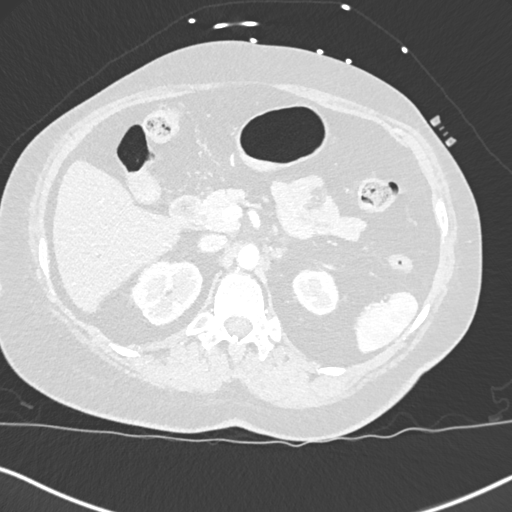
[im 37/278  soft-tissue]
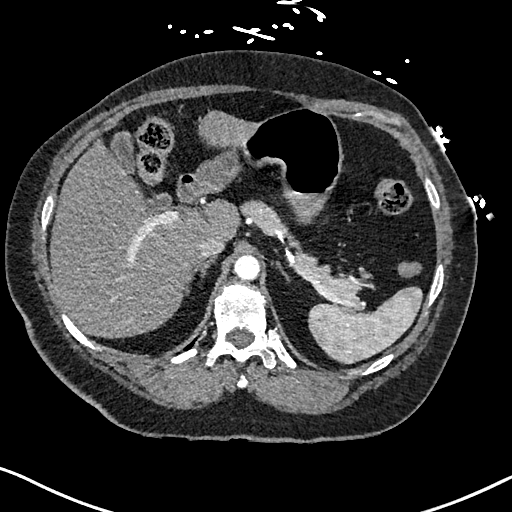
[im 49/278  lung]
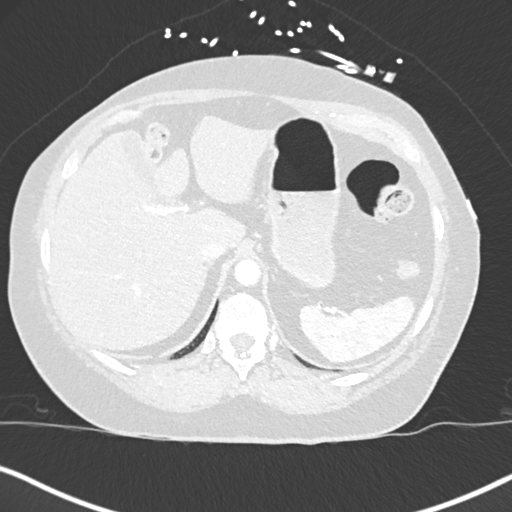
[im 61/278  soft-tissue]
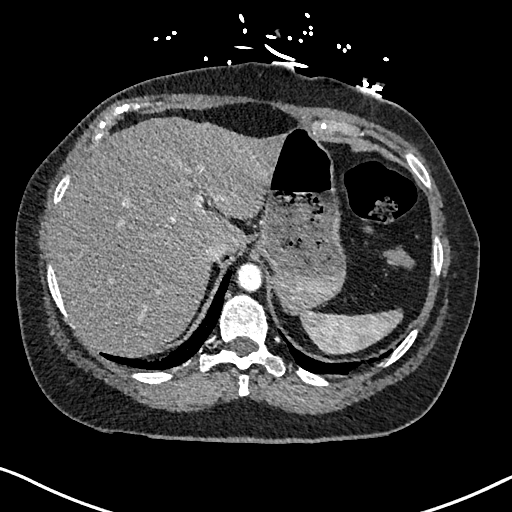
[im 85/278  lung]
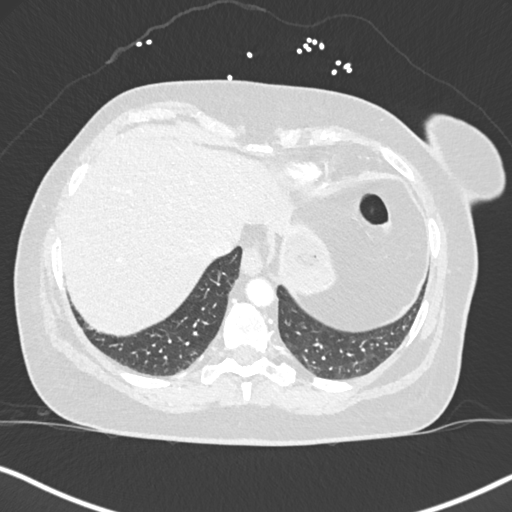
[im 97/278  soft-tissue]
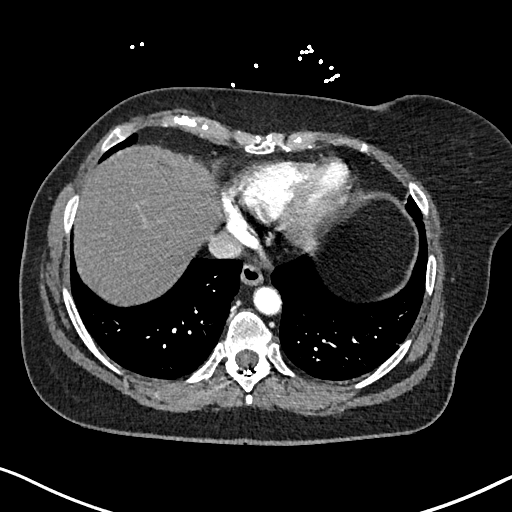
[im 109/278  lung]
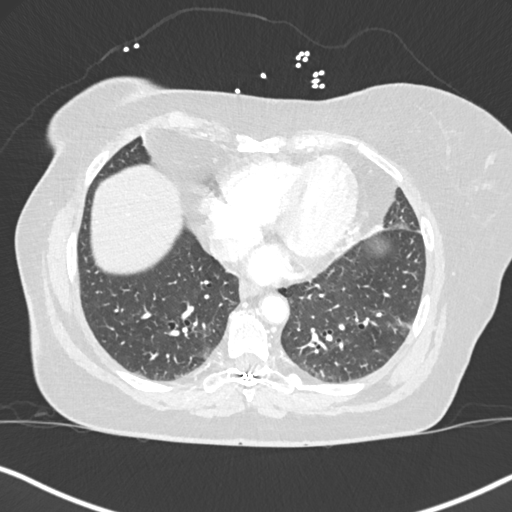
[im 133/278  soft-tissue]
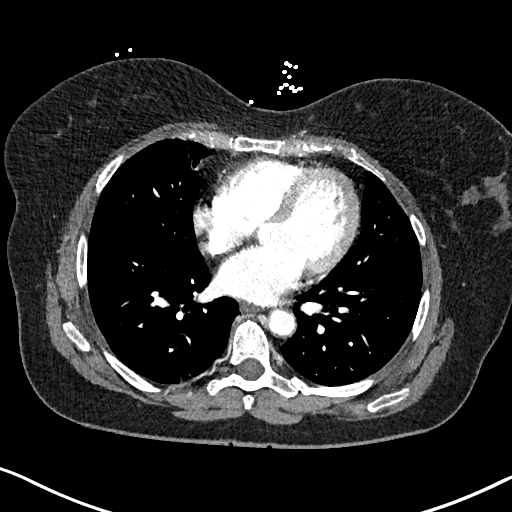
[im 145/278  lung]
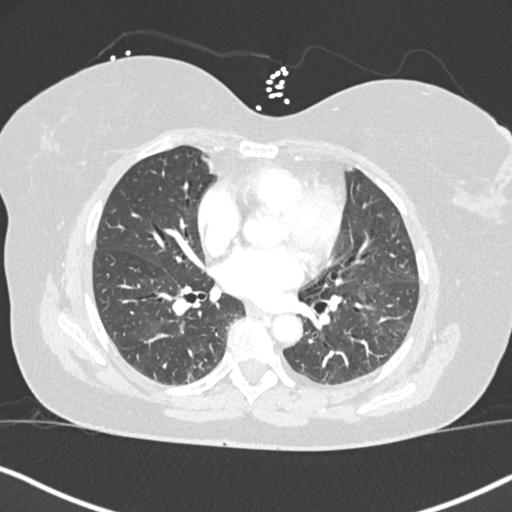
[im 169/278  soft-tissue]
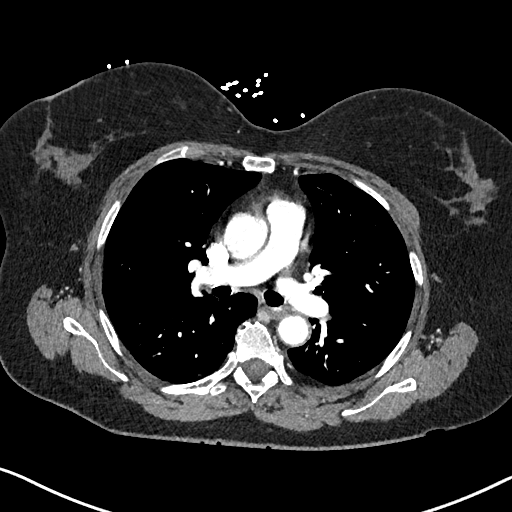
[im 181/278  lung]
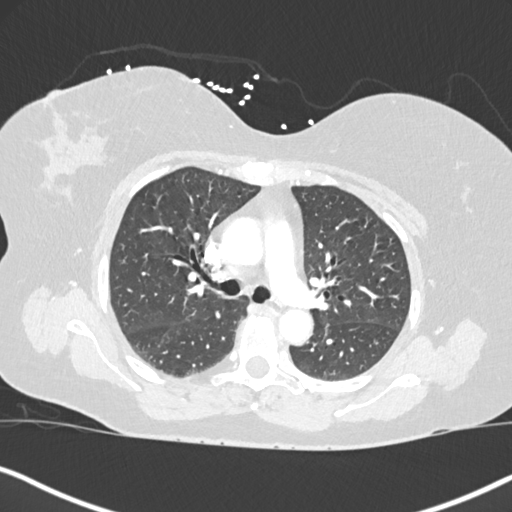
[im 193/278  soft-tissue]
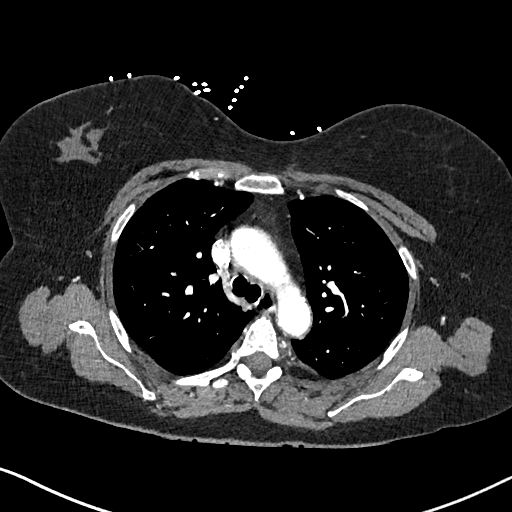
[im 217/278  lung]
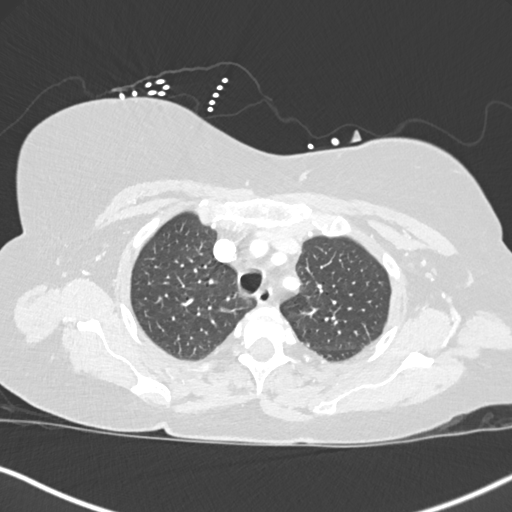
[im 229/278  soft-tissue]
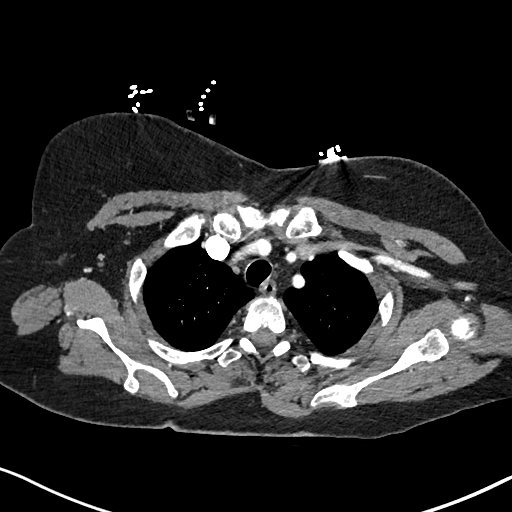
[im 241/278  lung]
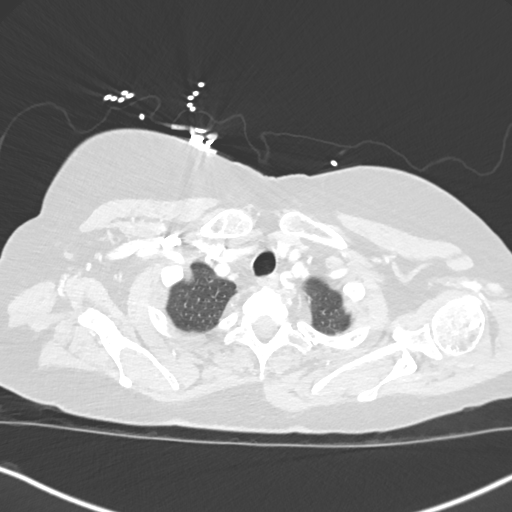
[im 265/278  soft-tissue]
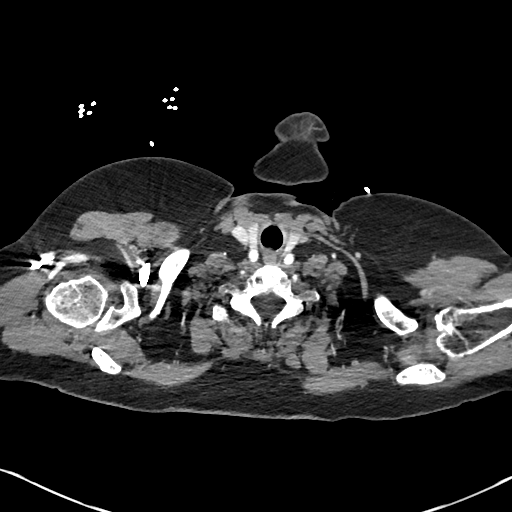

[Series 8: coronal mpr · coronal · 0.59mm/px · 3 of 108 slices shown]
[im 27/108  soft-tissue]
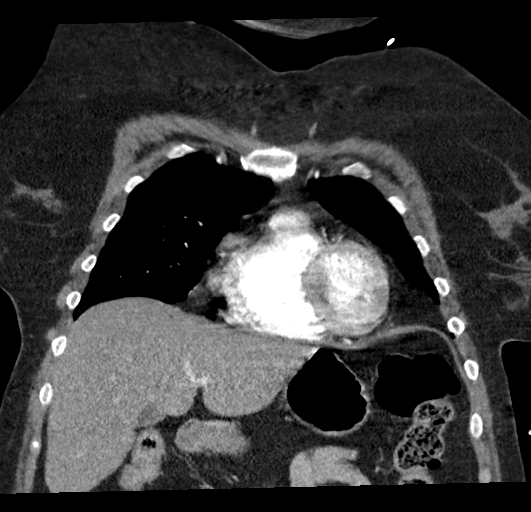
[im 54/108  soft-tissue]
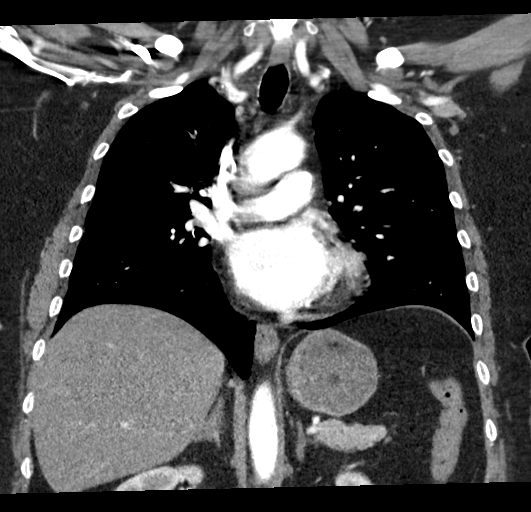
[im 81/108  soft-tissue]
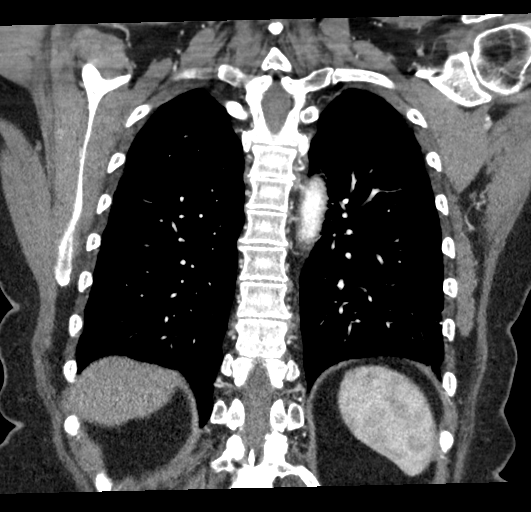

[19 of 46 positions shown; findings below may reference images not displayed]

FINDINGS: Cardiovascular: Some of the most peripheral segmental and
subsegmental pulmonary artery branches are difficult to definitively
characterize due to mild patient breathing motion artifact, however,
there is no pulmonary embolism identified within the main, lobar or
central segmental pulmonary arteries bilaterally.

No thoracic aortic aneurysm or evidence of aortic dissection. Heart
size is within normal limits. No pericardial effusion seen.

Mediastinum/Nodes: No mass or enlarged lymph nodes appreciated
within the mediastinum or perihilar regions. Esophagus is
unremarkable. Trachea and central bronchi are unremarkable.

Lungs/Pleura: Lungs are clear. No pleural effusion or pneumothorax.

Upper Abdomen: Limited images of the upper abdomen are unremarkable.

Musculoskeletal: No acute or suspicious osseous finding mild
degenerative spurring at multiple levels of the slightly scoliotic
thoracic spine. Stimulator device within the thoracic canal. No
chest wall abnormality identified.

Review of the MIP images confirms the above findings.
IMPRESSION: No acute findings. No pulmonary embolism seen, with mild study
limitations detailed above. No aortic aneurysm or evidence of aortic
dissection. No pneumonia or pulmonary edema.

## 2022-01-09 ENCOUNTER — Emergency Department (HOSPITAL_COMMUNITY): Payer: Medicare Other

## 2022-01-09 ENCOUNTER — Other Ambulatory Visit: Payer: Self-pay

## 2022-01-09 ENCOUNTER — Emergency Department (HOSPITAL_COMMUNITY)
Admission: EM | Admit: 2022-01-09 | Discharge: 2022-01-09 | Disposition: A | Discharge status: Home / Self Care | Payer: Medicare Other | Attending: Emergency Medicine | Admitting: Emergency Medicine

## 2022-01-09 ENCOUNTER — Encounter (HOSPITAL_COMMUNITY): Payer: Self-pay | Admitting: Emergency Medicine

## 2022-01-09 DIAGNOSIS — M542 Cervicalgia: Secondary | ICD-10-CM | POA: Insufficient documentation

## 2022-01-09 DIAGNOSIS — Z7951 Long term (current) use of inhaled steroids: Secondary | ICD-10-CM | POA: Insufficient documentation

## 2022-01-09 DIAGNOSIS — D649 Anemia, unspecified: Secondary | ICD-10-CM | POA: Insufficient documentation

## 2022-01-09 DIAGNOSIS — Z79899 Other long term (current) drug therapy: Secondary | ICD-10-CM | POA: Diagnosis not present

## 2022-01-09 DIAGNOSIS — I10 Essential (primary) hypertension: Secondary | ICD-10-CM | POA: Insufficient documentation

## 2022-01-09 DIAGNOSIS — R42 Dizziness and giddiness: Secondary | ICD-10-CM | POA: Insufficient documentation

## 2022-01-09 DIAGNOSIS — J45909 Unspecified asthma, uncomplicated: Secondary | ICD-10-CM | POA: Insufficient documentation

## 2022-01-09 DIAGNOSIS — R531 Weakness: Secondary | ICD-10-CM | POA: Diagnosis not present

## 2022-01-09 LAB — CBG MONITORING, ED: Glucose-Capillary: 184 mg/dL — ABNORMAL HIGH (ref 70–99)

## 2022-01-09 LAB — BASIC METABOLIC PANEL
Anion gap: 9 (ref 5–15)
BUN: 18 mg/dL (ref 8–23)
CO2: 24 mmol/L (ref 22–32)
Calcium: 9.3 mg/dL (ref 8.9–10.3)
Chloride: 102 mmol/L (ref 98–111)
Creatinine, Ser: 1.3 mg/dL — ABNORMAL HIGH (ref 0.44–1.00)
GFR, Estimated: 45 mL/min — ABNORMAL LOW (ref >60)
Glucose, Bld: 129 mg/dL — ABNORMAL HIGH (ref 70–99)
Potassium: 5 mmol/L (ref 3.5–5.1)
Sodium: 135 mmol/L (ref 135–145)

## 2022-01-09 LAB — CBC
HCT: 32.8 % — ABNORMAL LOW (ref 36.0–46.0)
Hemoglobin: 10.9 g/dL — ABNORMAL LOW (ref 12.0–15.0)
MCH: 31.1 pg (ref 26.0–34.0)
MCHC: 33.2 g/dL (ref 30.0–36.0)
MCV: 93.4 fL (ref 80.0–100.0)
Platelets: 328 10*3/uL (ref 150–400)
RBC: 3.51 MIL/uL — ABNORMAL LOW (ref 3.87–5.11)
RDW: 16.2 % — ABNORMAL HIGH (ref 11.5–15.5)
WBC: 10.8 10*3/uL — ABNORMAL HIGH (ref 4.0–10.5)
nRBC: 0 % (ref 0.0–0.2)

## 2022-01-09 LAB — URINALYSIS, ROUTINE W REFLEX MICROSCOPIC
Bilirubin Urine: NEGATIVE
Glucose, UA: NEGATIVE mg/dL
Hgb urine dipstick: NEGATIVE
Ketones, ur: NEGATIVE mg/dL
Leukocytes,Ua: NEGATIVE
Nitrite: NEGATIVE
Protein, ur: NEGATIVE mg/dL
Specific Gravity, Urine: 1.006 (ref 1.005–1.030)
pH: 5 (ref 5.0–8.0)

## 2022-01-09 MED ORDER — METHOCARBAMOL 500 MG PO TABS: 500.0000 mg | ORAL_TABLET | Freq: Three times a day (TID) | ORAL | 0 refills | Status: AC | PRN

## 2022-01-09 MED ORDER — LIDOCAINE 5 % EX PTCH
1.0000 | MEDICATED_PATCH | CUTANEOUS | Status: DC
  Administered 2022-01-09: 1 via TRANSDERMAL
  Filled 2022-01-09: qty 1

## 2022-01-09 MED ORDER — MECLIZINE HCL 25 MG PO TABS: 25.0000 mg | ORAL_TABLET | Freq: Two times a day (BID) | ORAL | 0 refills | Status: AC | PRN

## 2022-01-09 MED ORDER — MECLIZINE HCL 12.5 MG PO TABS
25.0000 mg | ORAL_TABLET | Freq: Once | ORAL | Status: AC
  Administered 2022-01-09: 25 mg via ORAL
  Filled 2022-01-09: qty 2

## 2022-01-09 MED ORDER — PREDNISONE 10 MG (21) PO TBPK: ORAL_TABLET | Freq: Every day | ORAL | 0 refills | Status: AC

## 2022-01-09 MED ORDER — LORAZEPAM 1 MG PO TABS
1.0000 mg | ORAL_TABLET | Freq: Once | ORAL | Status: AC | PRN
  Administered 2022-01-09: 1 mg via ORAL
  Filled 2022-01-09: qty 1

## 2022-01-09 MED ORDER — OXYCODONE-ACETAMINOPHEN 5-325 MG PO TABS
1.0000 | ORAL_TABLET | Freq: Once | ORAL | Status: AC
  Administered 2022-01-09: 1 via ORAL
  Filled 2022-01-09: qty 1

## 2022-01-09 MED ORDER — IOHEXOL 350 MG/ML SOLN
100.0000 mL | Freq: Once | INTRAVENOUS | Status: AC | PRN
  Administered 2022-01-09: 80 mL via INTRAVENOUS

## 2022-01-09 MED ORDER — LIDOCAINE 5 % EX PTCH: 1.0000 | MEDICATED_PATCH | CUTANEOUS | 0 refills | Status: AC

## 2022-01-09 NOTE — ED Notes (Signed)
ED Provider at bedside. 

## 2022-01-09 NOTE — ED Provider Triage Note (Signed)
Emergency Medicine Provider Triage Evaluation Note  Cynthia Yang , a 66 y.o. female  was evaluated in triage.  Pt complains of neck pain with left arm weakness and low back pain with left leg weakness.  Patient had previous spinal surgery.  States the neck pain feels similar.  Since the neck pain has increased in severity she has noticed weakness in the left upper extremity.  The upper extremity weakness has been worsening over the course of a week or more.  She states over the past day she has began to have weakness in the left lower extremity.  She states that she has a spinal stimulator implanted that no longer functions.  Denies incontinence, urinary retention, saddle anesthesia, injury  Review of Systems  Positive: As above Negative: As above  Physical Exam  BP 122/64   Pulse 80   Temp 97.9 F (36.6 C) (Oral)   Resp 18   Ht 5\' 3"  (1.6 m)   Wt 67.1 kg   SpO2 95%   BMI 26.22 kg/m  Gen:   Awake, no distress   Resp:  Normal effort  MSK:   Moves extremities without difficulty  Other:  Left upper and lower extremities grossly weaker than contralateral extremities  Medical Decision Making  Medically screening exam initiated at 12:21 PM.  Appropriate orders placed.  Cynthia Yang was informed that the remainder of the evaluation will be completed by another provider, this initial triage assessment does not replace that evaluation, and the importance of remaining in the ED until their evaluation is complete.     Kathlyn Sacramento, PA-C 01/09/22 1223

## 2022-01-09 NOTE — ED Provider Notes (Signed)
[facility] EMERGENCY DEPARTMENT Provider Note   CSN: [id number] Arrival date & time: 01/09/22  1114     History {Add pertinent medical, surgical, social history, OB history to HPI:1} Chief Complaint  Patient presents with   Weakness    Cynthia Yang is a 66 y.o. female.   Weakness Associated symptoms: dizziness   Patient presents for multiple complaints.  Medical history includes HTN, opioid use, asthma, GERD, anxiety, fibromyalgia.  She has a history of cervical spinal surgery, most recently 10 years ago.  Prior surgeon was Dr. Franky Macho.  Over the past several weeks, she has had pain in the left lower part of her posterior neck.  This area is tender.  Pain is worsened with ipsilateral head rotation and neck flexion.  She is been taken a muscle relaxer at home with minimal relief.  She has noticed worsening weakness in her left upper extremity, as well as some mild numbness and paresthesias.  Weakness was most noticeable yesterday when she had a difficult time holding objects in her left hand.  She also has had weakness in her bilateral lower extremities, greater on the left that has been progressive over the past 2 to 3 days.  This has caused inability to walk.  She endorses some dizziness which also limits her ambulation.  At baseline, she is able to ambulate without assistance.  Patient has a spinal stimulator device implanted that no longer functions.  Because of this, she is unable to undergo MRI.     Home Medications Prior to Admission medications   Medication Sig Start Date End Date Taking? Authorizing Provider  albuterol (VENTOLIN HFA) 108 (90 Base) MCG/ACT inhaler Inhale 2 puffs into the lungs every 4 (four) hours as needed for wheezing or shortness of breath.    [provider]  amitriptyline (ELAVIL) 75 MG tablet Take 75 mg by mouth at bedtime.    [provider]  Jackal[facility] [place] [facility] records had no original sig 04/02/20   [provider]  fluticasone (FLONASE) 50 MCG/ACT nasal spray Place 1 spray into both nostrils daily. 01/06/17   [provider]  gabapentin (NEURONTIN) 400 MG capsule Take 400 mg by mouth 3 (three) times daily.    [provider]  guaiFENesin-dextromethorphan (ROBITUSSIN DM) 100-10 MG/5ML syrup Take 10 mLs by mouth every 4 (four) hours as needed for cough. 04/06/20   Sherryll Burger, Pratik D, DO  hydrALAZINE (APRESOLINE) 25 MG tablet Take 1 tablet (25 mg total) by mouth every 8 (eight) hours. 04/06/20 05/06/20  Sherryll Burger, Pratik D, DO  HYDROcodone-homatropine (HYCODAN) 5-1.5 MG/5ML syrup Take 5 mLs by mouth every 4 (four) hours as needed for cough. Patient not taking: Reported on 04/03/2020 12/12/18   Catarina Hartshorn, MD  levocetirizine (XYZAL) 5 MG tablet Take 1 tablet by mouth at bedtime.    [provider]  lisinopril (ZESTRIL) 40 MG tablet Take 1 tablet (40 mg total) by mouth daily. 04/06/20 05/06/20  Sherryll Burger, Pratik D, DO  metoprolol tartrate (LOPRESSOR) 50 MG tablet Take 1 tablet (50 mg total) by mouth 2 (two) times daily. Patient taking differently: Take 100 mg by mouth 2 (two) times daily.  01/22/17   Beaulah Dinning, MD  montelukast (SINGULAIR) 10 MG tablet Take 1 tablet (10 mg total) by mouth at bedtime. 12/12/18   Catarina Hartshorn, MD  SYMBICORT 160-4.5 MCG/ACT inhaler Inhale 2 puffs into the lungs 2 (two) times daily as needed for wheezing. Patient not taking: Reported on 04/03/2020 11/11/16  [provider]  tiZANidine (ZANAFLEX) 4 MG capsule Take 1 tablet by mouth every 6 (six) hours.    [provider]      Allergies    Other, Morphine and related, Norvasc [amlodipine], Trazodone and nefazodone, Vistaril [hydroxyzine hcl], Duloxetine hcl, and Trazodone    Review of Systems   Review of Systems  Musculoskeletal:  Positive for gait problem.  Neurological:  Positive for dizziness, weakness and numbness.  All other systems reviewed and are negative.   Physical  Exam Updated Vital Signs BP 122/64   Pulse 80   Temp 97.9 F (36.6 C) (Oral)   Resp 18   Ht 5\' 3"  (1.6 m)   Wt 67.1 kg   SpO2 95%   BMI 26.22 kg/m  Physical Exam Vitals and nursing note reviewed.  Constitutional:      General: She is not in acute distress.    Appearance: She is well-developed. She is not toxic-appearing or diaphoretic.  HENT:     Head: Normocephalic and atraumatic.     Right Ear: External ear normal.     Left Ear: External ear normal.     Nose: Nose normal.     Mouth/Throat:     Mouth: Mucous membranes are moist.     Pharynx: Oropharynx is clear.  Eyes:     Extraocular Movements: Extraocular movements intact.     Conjunctiva/sclera: Conjunctivae normal.  Neck:   Cardiovascular:     Rate and Rhythm: Normal rate and regular rhythm.     Heart sounds: No murmur heard. Pulmonary:     Effort: Pulmonary effort is normal. No respiratory distress.     Breath sounds: Normal breath sounds. No wheezing or rales.  Abdominal:     General: There is no distension.     Palpations: Abdomen is soft.     Tenderness: There is no abdominal tenderness.  Musculoskeletal:        General: No swelling.     Cervical back: Neck supple. Tenderness present. Pain with movement present.  Skin:    General: Skin is warm and dry.     Coloration: Skin is not jaundiced or pale.  Neurological:     Mental Status: She is alert and oriented to person, place, and time.     Cranial Nerves: No cranial nerve deficit.     Sensory: Sensory deficit present.     Motor: Weakness present.  Psychiatric:        Mood and Affect: Mood normal.        Behavior: Behavior normal.     ED Results / Procedures / Treatments   Labs (all labs ordered are listed, but only abnormal results are displayed) Labs Reviewed  BASIC METABOLIC PANEL - Abnormal; Notable for the following components:      Result Value   Glucose, Bld 129 (*)    Creatinine, Ser 1.30 (*)    GFR, Estimated 45 (*)    All other  components within normal limits  CBC - Abnormal; Notable for the following components:   WBC 10.8 (*)    RBC 3.51 (*)    Hemoglobin 10.9 (*)    HCT 32.8 (*)    RDW 16.2 (*)    All other components within normal limits  URINALYSIS, ROUTINE W REFLEX MICROSCOPIC - Abnormal; Notable for the following components:   Color, Urine STRAW (*)    All other components within normal limits  CBG MONITORING, ED - Abnormal; Notable for the following components:   Glucose-Capillary  184 (*)    All other components within normal limits    EKG None  Radiology No results found.  Procedures Procedures  {Document cardiac monitor, telemetry assessment procedure when appropriate:1}  Medications Ordered in ED Medications  LORazepam (ATIVAN) tablet 1 mg (1 mg Oral Given 01/09/22 1412)    ED Course/ Medical Decision Making/ A&P                           Medical Decision Making Amount and/or Complexity of Data Reviewed Labs: ordered. Radiology: ordered.  Risk Prescription drug management.   This patient presents to the ED for concern of ***, this involves an extensive number of treatment options, and is a complaint that carries with it a high risk of complications and morbidity.  The differential diagnosis includes ***   Co morbidities that complicate the patient evaluation  ***   Additional history obtained:  Additional history obtained from *** External records from outside source obtained and reviewed including ***   Lab Tests:  I Ordered, and personally interpreted labs.  The pertinent results include:  ***   Imaging Studies ordered:  I ordered imaging studies including ***  I independently visualized and interpreted imaging which showed *** I agree with the radiologist interpretation   Cardiac Monitoring: / EKG:  The patient was maintained on a cardiac monitor.  I personally viewed and interpreted the cardiac monitored which showed an underlying rhythm of:  ***   Consultations Obtained:  I requested consultation with the ***,  and discussed lab and imaging findings as well as pertinent plan - they recommend: ***   Problem List / ED Course / Critical interventions / Medication management  *** I ordered medication including ***  for ***  Reevaluation of the patient after these medicines showed that the patient {resolved/improved/worsened:23923::"improved"} I have reviewed the patients home medicines and have made adjustments as needed   Social Determinants of Health:  ***   Test / Admission - Considered:  ***   {Document critical care time when appropriate:1} {Document review of labs and clinical decision tools ie heart score, Chads2Vasc2 etc:1}  {Document your independent review of radiology images, and any outside records:1} {Document your discussion with family members, caretakers, and with consultants:1} {Document social determinants of health affecting pt's care:1} {Document your decision making why or why not admission, treatments were needed:1} Final Clinical Impression(s) / ED Diagnoses Final diagnoses:  None    Rx / DC Orders ED Discharge Orders     None

## 2022-01-09 NOTE — ED Notes (Signed)
Patient transported to CT 

## 2022-01-09 NOTE — Discharge Instructions (Signed)
There were medications sent to your pharmacy: -Meclizine is the medicine to treat dizziness.  Take this only as needed -Lidocaine patches can be applied to area of focal pain.  Avoid having more than 1 of these patches on your skin at any given time -Robaxin is a muscle relaxer.  Take this as needed.  Do not take in combination with any other muscle relaxers.  This includes tizanidine. -Prednisone is a steroid to put back on inflammation.  This is prescribed in a tapering dose.  Take as prescribed.  -Follow-up with Dr. Franky Macho for further evaluation. -Return to the emergency department at any time for any new or worsening symptoms of concern.

## 2022-01-09 NOTE — ED Triage Notes (Signed)
Pt presents for evaluation of weakness x over 1 week, weakness in left arm greater than right, with difficulty walking, also having neck pain, PMH includes back surgeries.

## 2022-01-27 ENCOUNTER — Other Ambulatory Visit (HOSPITAL_COMMUNITY): Payer: Self-pay | Admitting: Neurosurgery

## 2022-01-27 ENCOUNTER — Other Ambulatory Visit: Payer: Self-pay | Admitting: Neurosurgery

## 2022-01-27 DIAGNOSIS — M25511 Pain in right shoulder: Secondary | ICD-10-CM

## 2022-01-27 DIAGNOSIS — M25512 Pain in left shoulder: Secondary | ICD-10-CM

## 2022-02-04 IMAGING — DX DG CHEST 1V PORT
1 series · 1 of 1 positions shown · non-contrast
Comparison: CTA chest and chest x-ray dated December 09, 2018.

CLINICAL DATA: Shortness of breath.  Recent COVID 19 diagnosis.

EXAM:
PORTABLE CHEST 1 VIEW

[chest ap]
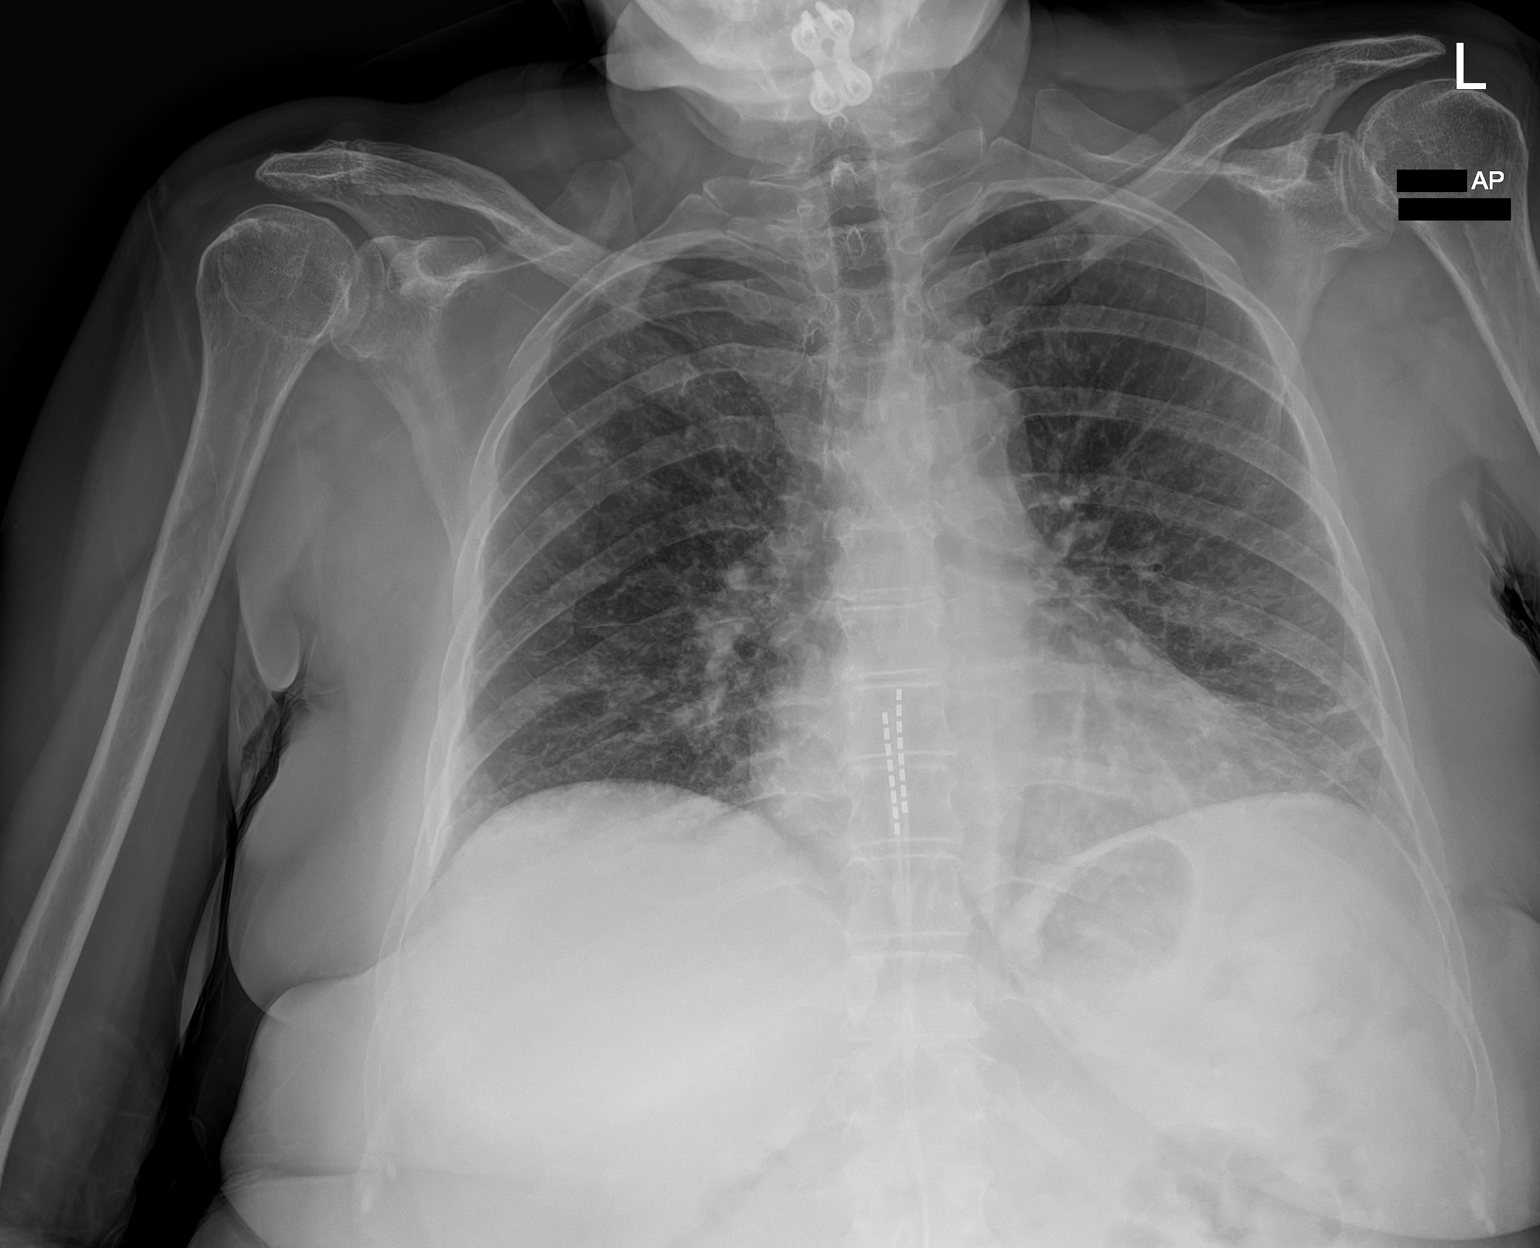

[1 of 1 positions shown; findings below may reference images not displayed]

FINDINGS: The heart size and mediastinal contours are within normal limits.
Normal pulmonary vascularity. Mild patchy peripheral hazy
interstitial and airspace opacities at the left greater than right
lung bases. No pleural effusion or pneumothorax. No acute osseous
abnormality. Spinal stimulator noted.
IMPRESSION: 1. Mild patchy peripheral hazy interstitial and airspace opacities
at the left greater than right lung bases, consistent with
multifocal COVID 19 pneumonia given clinical history.

## 2022-02-07 ENCOUNTER — Ambulatory Visit (HOSPITAL_COMMUNITY)
Admission: RE | Admit: 2022-02-07 | Discharge: 2022-02-07 | Disposition: A | Discharge status: Home / Self Care | Payer: Medicare Other | Source: Ambulatory Visit | Attending: Neurosurgery | Admitting: Neurosurgery

## 2022-02-07 DIAGNOSIS — M25511 Pain in right shoulder: Secondary | ICD-10-CM

## 2022-02-07 DIAGNOSIS — M25512 Pain in left shoulder: Secondary | ICD-10-CM | POA: Insufficient documentation

## 2022-02-07 MED ORDER — IOHEXOL 180 MG/ML  SOLN
20.0000 mL | Freq: Once | INTRAMUSCULAR | Status: AC | PRN
  Administered 2022-02-07: 9 mL via INTRA_ARTICULAR

## 2022-02-07 MED ORDER — SODIUM CHLORIDE (PF) 0.9 % IJ SOLN
10.0000 mL | Freq: Once | INTRAMUSCULAR | Status: AC
  Administered 2022-02-07: 5 mL

## 2022-02-07 MED ORDER — LIDOCAINE HCL (PF) 1 % IJ SOLN
5.0000 mL | Freq: Once | INTRAMUSCULAR | Status: AC
  Administered 2022-02-07: 2.5 mL
